# Patient Record
Sex: Female | Born: 1985 | Race: Black or African American | Hispanic: No | Marital: Married | Smoking: Never smoker
Health system: Southern US, Community
[De-identification: ages and names within clinical notes are randomized; demographics above are authoritative.]

## PROBLEM LIST (undated history)

## (undated) DIAGNOSIS — Z8489 Family history of other specified conditions: Secondary | ICD-10-CM

## (undated) DIAGNOSIS — D219 Benign neoplasm of connective and other soft tissue, unspecified: Secondary | ICD-10-CM

## (undated) HISTORY — PX: KNEE SURGERY: SHX244

---

## 2012-01-14 ENCOUNTER — Emergency Department (HOSPITAL_COMMUNITY)
Admission: EM | Admit: 2012-01-14 | Discharge: 2012-01-14 | Disposition: A | Discharge status: Home / Self Care | Payer: No Typology Code available for payment source | Attending: Emergency Medicine | Admitting: Emergency Medicine

## 2012-01-14 ENCOUNTER — Encounter (HOSPITAL_COMMUNITY): Payer: Self-pay | Admitting: *Deleted

## 2012-01-14 DIAGNOSIS — M25519 Pain in unspecified shoulder: Secondary | ICD-10-CM | POA: Insufficient documentation

## 2012-01-14 DIAGNOSIS — T148XXA Other injury of unspecified body region, initial encounter: Secondary | ICD-10-CM

## 2012-01-14 DIAGNOSIS — M542 Cervicalgia: Secondary | ICD-10-CM | POA: Insufficient documentation

## 2012-01-14 DIAGNOSIS — M79609 Pain in unspecified limb: Secondary | ICD-10-CM | POA: Insufficient documentation

## 2012-01-14 DIAGNOSIS — Y92410 Unspecified street and highway as the place of occurrence of the external cause: Secondary | ICD-10-CM | POA: Insufficient documentation

## 2012-01-14 MED ORDER — CYCLOBENZAPRINE HCL 10 MG PO TABS: 10.0000 mg | ORAL_TABLET | Freq: Two times a day (BID) | ORAL | Status: AC | PRN

## 2012-01-14 MED ORDER — TRAMADOL HCL 50 MG PO TABS: 50.0000 mg | ORAL_TABLET | Freq: Four times a day (QID) | ORAL | Status: AC | PRN
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
## 2012-01-14 NOTE — ED Provider Notes (Signed)
Medical screening examination/treatment/procedure(s) were performed by non-physician practitioner and as supervising physician I was immediately available for consultation/collaboration.   Clayton Bosserman R Zoua Caporaso, MD 01/14/12 1624 

## 2013-01-16 ENCOUNTER — Ambulatory Visit (INDEPENDENT_AMBULATORY_CARE_PROVIDER_SITE_OTHER): Payer: 59 | Admitting: Physician Assistant

## 2013-01-16 VITALS — BP 118/90 | HR 99 | Temp 98.3°F | Resp 16 | Ht 69.0 in | Wt 302.0 lb

## 2013-01-16 DIAGNOSIS — J069 Acute upper respiratory infection, unspecified: Secondary | ICD-10-CM

## 2013-01-16 DIAGNOSIS — J029 Acute pharyngitis, unspecified: Secondary | ICD-10-CM

## 2013-01-16 MED ORDER — BENZONATATE 100 MG PO CAPS: 100.0000 mg | ORAL_CAPSULE | Freq: Three times a day (TID) | ORAL | Status: DC | PRN

## 2013-01-16 MED ORDER — HYDROCODONE-HOMATROPINE 5-1.5 MG/5ML PO SYRP: 5.0000 mL | ORAL_SOLUTION | Freq: Three times a day (TID) | ORAL | Status: DC | PRN

## 2013-01-16 MED ORDER — IPRATROPIUM BROMIDE 0.03 % NA SOLN: 2.0000 | Freq: Two times a day (BID) | NASAL | Status: DC

## 2013-01-16 NOTE — Progress Notes (Signed)
  Subjective:    Patient ID: Duayne Cal, female    DOB: November 07, 1985, 27 y.o.   MRN: [id number]  HPI   Ms. Excell Seltzer is a very pleasant 27 yr old female here with concern for illness.  Symptoms began about 6 days ago.  Began with lots of nasal congestion, now with sore throat and cough.  Cough is mostly non-productive, though it felt a little looser this AM.  A little SOB at night but no wheezing.  Cough does keep her awake at night.  Denies fever, chills, HA, ear pain, abd pain, GI symptoms.  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  Has been using otc alkaseltzer which helps somewhat with symptoms.     Review of Systems  Constitutional: Negative for fever and chills.  HENT: Positive for congestion, sore throat, rhinorrhea and postnasal drip. Negative for ear pain.   Respiratory: Positive for cough and shortness of breath. Negative for wheezing.   Cardiovascular: Negative.   Gastrointestinal: Negative.   Musculoskeletal: Negative.   Skin: Negative.   Neurological: Negative.        Objective:   Physical Exam  Vitals reviewed. Constitutional: She is oriented to person, place, and time. She appears well-developed and well-nourished. No distress.  HENT:  Head: Normocephalic and atraumatic.  Right Ear: Tympanic membrane and ear canal normal.  Left Ear: Ear canal normal.  Nose: Nose normal.  Mouth/Throat: Uvula is midline. Posterior oropharyngeal erythema present. No oropharyngeal exudate, posterior oropharyngeal edema or tonsillar abscesses.  L canal occluded with cerumen  Eyes: Conjunctivae are normal. No scleral icterus.  Neck: Neck supple.  Cardiovascular: Normal rate, regular rhythm and normal heart sounds.  Exam reveals no gallop and no friction rub.   No murmur heard. Pulmonary/Chest: Effort normal and breath sounds normal. She has no wheezes. She has no rales.  Lymphadenopathy:    She has no cervical adenopathy.  Neurological: She is alert and oriented to person,  place, and time.  Skin: Skin is warm and dry.  Psychiatric: She has a normal mood and affect. Her behavior is normal.     Filed Vitals:   01/16/13 0917  BP: 118/90  Pulse: 99  Temp: 98.3 F (36.8 C)  Resp: 16    Results for orders placed in visit on 01/16/13  POCT RAPID STREP A (OFFICE)      Result Value Range   Rapid Strep A Screen Negative  Negative        Assessment & Plan:  Viral URI with cough - Plan: ipratropium (ATROVENT) 0.03 % nasal spray, benzonatate (TESSALON) 100 MG capsule, HYDROcodone-homatropine (HYCODAN) 5-1.5 MG/5ML syrup  Sore throat - Plan: POCT rapid strep A   Ms. Excell Seltzer is a pleasant 27 yr old female with URI and sore throat, suspect viral etiology.  Afebrile, rapid strep negative, exam reassuring.  Will treat with Atrovent, Tessalon, and Hycodan.  Push fluids, rest.  Discussed RTC precautions.  Pt understands and is in agreement with this plan.

## 2013-01-16 NOTE — Patient Instructions (Addendum)
Begin using the Atrovent nasal spray twice daily for relief of congestion, ear pressure, and post-nasal drainage.  Tessalon Perles every 8 hours as needed for cough.  Hycodan syrup if needed for cough at night - this will make you sleepy so only use at night.  Plenty of fluids (water is best!) and rest.  Please let me know if you feel like you are worsening or not improving (i.e. Fever, worsening cough, shortness of breath, etc)   Upper Respiratory Infection, Adult An upper respiratory infection (URI) is also sometimes known as the common cold. The upper respiratory tract includes the nose, sinuses, throat, trachea, and bronchi. Bronchi are the airways leading to the lungs. Most people improve within 1 week, but symptoms can last up to 2 weeks. A residual cough may last even longer.  CAUSES Many different viruses can infect the tissues lining the upper respiratory tract. The tissues become irritated and inflamed and often become very moist. Mucus production is also common. A cold is contagious. You can easily spread the virus to others by oral contact. This includes kissing, sharing a glass, coughing, or sneezing. Touching your mouth or nose and then touching a surface, which is then touched by another person, can also spread the virus. SYMPTOMS  Symptoms typically develop 1 to 3 days after you come in contact with a cold virus. Symptoms vary from person to person. They may include:  Runny nose.  Sneezing.  Nasal congestion.  Sinus irritation.  Sore throat.  Loss of voice (laryngitis).  Cough.  Fatigue.  Muscle aches.  Loss of appetite.  Headache.  Low-grade fever. DIAGNOSIS  You might diagnose your own cold based on familiar symptoms, since most people get a cold 2 to 3 times a year. Your caregiver can confirm this based on your exam. Most importantly, your caregiver can check that your symptoms are not due to another disease such as strep throat, sinusitis, pneumonia, asthma, or  epiglottitis. Blood tests, throat tests, and X-rays are not necessary to diagnose a common cold, but they may sometimes be helpful in excluding other more serious diseases. Your caregiver will decide if any further tests are required. RISKS AND COMPLICATIONS  You may be at risk for a more severe case of the common cold if you smoke cigarettes, have chronic heart disease (such as heart failure) or lung disease (such as asthma), or if you have a weakened immune system. The very young and very old are also at risk for more serious infections. Bacterial sinusitis, middle ear infections, and bacterial pneumonia can complicate the common cold. The common cold can worsen asthma and chronic obstructive pulmonary disease (COPD). Sometimes, these complications can require emergency medical care and may be life-threatening. PREVENTION  The best way to protect against getting a cold is to practice good hygiene. Avoid oral or hand contact with people with cold symptoms. Wash your hands often if contact occurs. There is no clear evidence that vitamin C, vitamin E, echinacea, or exercise reduces the chance of developing a cold. However, it is always recommended to get plenty of rest and practice good nutrition. TREATMENT  Treatment is directed at relieving symptoms. There is no cure. Antibiotics are not effective, because the infection is caused by a virus, not by bacteria. Treatment may include:  Increased fluid intake. Sports drinks offer valuable electrolytes, sugars, and fluids.  Breathing heated mist or steam (vaporizer or shower).  Eating chicken soup or other clear broths, and maintaining good nutrition.  Getting plenty of  rest.  Using gargles or lozenges for comfort.  Controlling fevers with ibuprofen or acetaminophen as directed by your caregiver.  Increasing usage of your inhaler if you have asthma. Zinc gel and zinc lozenges, taken in the first 24 hours of the common cold, can shorten the duration  and lessen the severity of symptoms. Pain medicines may help with fever, muscle aches, and throat pain. A variety of non-prescription medicines are available to treat congestion and runny nose. Your caregiver can make recommendations and may suggest nasal or lung inhalers for other symptoms.  HOME CARE INSTRUCTIONS   Only take over-the-counter or prescription medicines for pain, discomfort, or fever as directed by your caregiver.  Use a warm mist humidifier or inhale steam from a shower to increase air moisture. This may keep secretions moist and make it easier to breathe.  Drink enough water and fluids to keep your urine clear or pale yellow.  Rest as needed.  Return to work when your temperature has returned to normal or as your caregiver advises. You may need to stay home longer to avoid infecting others. You can also use a face mask and careful hand washing to prevent spread of the virus. SEEK MEDICAL CARE IF:   After the first few days, you feel you are getting worse rather than better.  You need your caregiver's advice about medicines to control symptoms.  You develop chills, worsening shortness of breath, or brown or red sputum. These may be signs of pneumonia.  You develop yellow or brown nasal discharge or pain in the face, especially when you bend forward. These may be signs of sinusitis.  You develop a fever, swollen neck glands, pain with swallowing, or white areas in the back of your throat. These may be signs of strep throat. SEEK IMMEDIATE MEDICAL CARE IF:   You have a fever.  You develop severe or persistent headache, ear pain, sinus pain, or chest pain.  You develop wheezing, a prolonged cough, cough up blood, or have a change in your usual mucus (if you have chronic lung disease).  You develop sore muscles or a stiff neck. Document Released: 02/25/2001 Document Revised: 11/24/2011 Document Reviewed: 01/03/2011 [facility] Patient Information 2013 [place], [place].

## 2013-01-17 ENCOUNTER — Encounter (HOSPITAL_COMMUNITY): Payer: Self-pay | Admitting: *Deleted

## 2014-11-22 ENCOUNTER — Encounter (HOSPITAL_BASED_OUTPATIENT_CLINIC_OR_DEPARTMENT_OTHER): Payer: Self-pay | Admitting: *Deleted

## 2014-11-22 DIAGNOSIS — S161XXA Strain of muscle, fascia and tendon at neck level, initial encounter: Secondary | ICD-10-CM | POA: Diagnosis not present

## 2014-11-22 DIAGNOSIS — S39012A Strain of muscle, fascia and tendon of lower back, initial encounter: Secondary | ICD-10-CM | POA: Diagnosis not present

## 2014-11-22 DIAGNOSIS — Y9389 Activity, other specified: Secondary | ICD-10-CM | POA: Diagnosis not present

## 2014-11-22 DIAGNOSIS — Y92410 Unspecified street and highway as the place of occurrence of the external cause: Secondary | ICD-10-CM | POA: Diagnosis not present

## 2014-11-22 DIAGNOSIS — Y998 Other external cause status: Secondary | ICD-10-CM | POA: Insufficient documentation

## 2014-11-22 DIAGNOSIS — S3992XA Unspecified injury of lower back, initial encounter: Secondary | ICD-10-CM | POA: Diagnosis present
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
## 2014-11-23 ENCOUNTER — Emergency Department (HOSPITAL_BASED_OUTPATIENT_CLINIC_OR_DEPARTMENT_OTHER)
Admission: EM | Admit: 2014-11-23 | Discharge: 2014-11-23 | Disposition: A | Discharge status: Home / Self Care | Payer: BLUE CROSS/BLUE SHIELD | Attending: Emergency Medicine | Admitting: Emergency Medicine

## 2014-11-23 ENCOUNTER — Emergency Department (HOSPITAL_BASED_OUTPATIENT_CLINIC_OR_DEPARTMENT_OTHER): Payer: BLUE CROSS/BLUE SHIELD

## 2014-11-23 DIAGNOSIS — S39012A Strain of muscle, fascia and tendon of lower back, initial encounter: Secondary | ICD-10-CM

## 2014-11-23 DIAGNOSIS — S161XXA Strain of muscle, fascia and tendon at neck level, initial encounter: Secondary | ICD-10-CM

## 2014-11-23 MED ORDER — CYCLOBENZAPRINE HCL 10 MG PO TABS: 10.0000 mg | ORAL_TABLET | Freq: Three times a day (TID) | ORAL | Status: DC | PRN
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
## 2014-11-23 NOTE — ED Notes (Signed)
C/o low back pain, (denies: nvd, fever, cough congestion cold sx, sob, LOC or dizziness), alert, NAD, calm, itneractive, resps e/u, speaking in clear complete sentences, VSS. No meds PTA.
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
## 2015-01-03 ENCOUNTER — Other Ambulatory Visit: Payer: Self-pay | Admitting: Family

## 2015-01-03 ENCOUNTER — Ambulatory Visit
Admission: RE | Admit: 2015-01-03 | Discharge: 2015-01-03 | Disposition: A | Discharge status: Home / Self Care | Payer: BLUE CROSS/BLUE SHIELD | Source: Ambulatory Visit | Attending: Family | Admitting: Family

## 2015-01-03 DIAGNOSIS — R509 Fever, unspecified: Secondary | ICD-10-CM

## 2015-10-25 ENCOUNTER — Other Ambulatory Visit: Payer: Self-pay | Admitting: Obstetrics and Gynecology

## 2015-10-25 ENCOUNTER — Other Ambulatory Visit (HOSPITAL_COMMUNITY)
Admission: RE | Admit: 2015-10-25 | Discharge: 2015-10-25 | Disposition: A | Discharge status: Home / Self Care | Payer: No Typology Code available for payment source | Source: Ambulatory Visit | Attending: Obstetrics and Gynecology | Admitting: Obstetrics and Gynecology

## 2015-10-25 DIAGNOSIS — Z01419 Encounter for gynecological examination (general) (routine) without abnormal findings: Secondary | ICD-10-CM | POA: Insufficient documentation

## 2015-10-26 LAB — CYTOLOGY - PAP

## 2015-11-20 LAB — OB RESULTS CONSOLE GC/CHLAMYDIA
CHLAMYDIA, DNA PROBE: NEGATIVE
GC PROBE AMP, GENITAL: NEGATIVE

## 2015-11-20 LAB — OB RESULTS CONSOLE HEPATITIS B SURFACE ANTIGEN: Hepatitis B Surface Ag: NEGATIVE

## 2015-11-20 LAB — OB RESULTS CONSOLE ABO/RH: RH Type: POSITIVE

## 2015-11-20 LAB — OB RESULTS CONSOLE ANTIBODY SCREEN: ANTIBODY SCREEN: NEGATIVE

## 2015-11-20 LAB — OB RESULTS CONSOLE RPR: RPR: NONREACTIVE

## 2015-11-20 LAB — OB RESULTS CONSOLE HIV ANTIBODY (ROUTINE TESTING): HIV: NONREACTIVE

## 2015-11-20 LAB — OB RESULTS CONSOLE RUBELLA ANTIBODY, IGM: Rubella: IMMUNE

## 2016-01-17 ENCOUNTER — Encounter (HOSPITAL_COMMUNITY): Payer: Self-pay

## 2016-01-17 ENCOUNTER — Other Ambulatory Visit (HOSPITAL_COMMUNITY): Payer: Self-pay | Admitting: Obstetrics and Gynecology

## 2016-01-17 ENCOUNTER — Ambulatory Visit (HOSPITAL_COMMUNITY)
Admission: RE | Admit: 2016-01-17 | Discharge: 2016-01-17 | Disposition: A | Discharge status: Home / Self Care | Payer: Commercial Managed Care - HMO | Source: Ambulatory Visit | Attending: Obstetrics and Gynecology | Admitting: Obstetrics and Gynecology

## 2016-01-17 DIAGNOSIS — IMO0002 Reserved for concepts with insufficient information to code with codable children: Secondary | ICD-10-CM

## 2016-01-17 DIAGNOSIS — Z3A14 14 weeks gestation of pregnancy: Secondary | ICD-10-CM

## 2016-01-17 DIAGNOSIS — Z3689 Encounter for other specified antenatal screening: Secondary | ICD-10-CM

## 2016-01-21 ENCOUNTER — Ambulatory Visit (HOSPITAL_COMMUNITY): Payer: No Typology Code available for payment source

## 2016-03-10 ENCOUNTER — Encounter (HOSPITAL_COMMUNITY): Payer: Self-pay | Admitting: Obstetrics and Gynecology

## 2016-03-11 ENCOUNTER — Other Ambulatory Visit (HOSPITAL_COMMUNITY): Payer: Self-pay | Admitting: Obstetrics and Gynecology

## 2016-03-11 DIAGNOSIS — Z3689 Encounter for other specified antenatal screening: Secondary | ICD-10-CM

## 2016-03-17 ENCOUNTER — Ambulatory Visit (HOSPITAL_COMMUNITY): Payer: No Typology Code available for payment source

## 2016-03-31 ENCOUNTER — Ambulatory Visit (HOSPITAL_COMMUNITY)
Admission: RE | Admit: 2016-03-31 | Discharge: 2016-03-31 | Disposition: A | Discharge status: Home / Self Care | Payer: Managed Care, Other (non HMO) | Source: Ambulatory Visit | Attending: Obstetrics and Gynecology | Admitting: Obstetrics and Gynecology

## 2016-03-31 DIAGNOSIS — Z3689 Encounter for other specified antenatal screening: Secondary | ICD-10-CM

## 2016-03-31 DIAGNOSIS — Z36 Encounter for antenatal screening of mother: Secondary | ICD-10-CM | POA: Diagnosis present

## 2016-03-31 DIAGNOSIS — Z3A25 25 weeks gestation of pregnancy: Secondary | ICD-10-CM | POA: Insufficient documentation

## 2016-03-31 DIAGNOSIS — O99212 Obesity complicating pregnancy, second trimester: Secondary | ICD-10-CM | POA: Diagnosis not present

## 2016-06-17 LAB — OB RESULTS CONSOLE GBS: GBS: NEGATIVE

## 2016-07-07 ENCOUNTER — Encounter (HOSPITAL_COMMUNITY): Payer: Self-pay | Admitting: *Deleted

## 2016-07-08 ENCOUNTER — Encounter (HOSPITAL_COMMUNITY)
Admission: RE | Admit: 2016-07-08 | Discharge: 2016-07-08 | Disposition: A | Discharge status: Home / Self Care | Payer: Managed Care, Other (non HMO) | Source: Ambulatory Visit | Attending: Obstetrics and Gynecology | Admitting: Obstetrics and Gynecology

## 2016-07-08 ENCOUNTER — Other Ambulatory Visit: Payer: Self-pay | Admitting: Obstetrics and Gynecology

## 2016-07-08 LAB — CBC
HCT: 37.2 % (ref 36.0–46.0)
Hemoglobin: 12.9 g/dL (ref 12.0–15.0)
MCH: 28.9 pg (ref 26.0–34.0)
MCHC: 34.7 g/dL (ref 30.0–36.0)
MCV: 83.2 fL (ref 78.0–100.0)
Platelets: 263 10*3/uL (ref 150–400)
RBC: 4.47 MIL/uL (ref 3.87–5.11)
RDW: 15.5 % (ref 11.5–15.5)
WBC: 9.1 10*3/uL (ref 4.0–10.5)

## 2016-07-08 LAB — TYPE AND SCREEN
ABO/RH(D): B POS
Antibody Screen: NEGATIVE

## 2016-07-08 LAB — ABO/RH: ABO/RH(D): B POS

## 2016-07-08 NOTE — Anesthesia Preprocedure Evaluation (Addendum)
Anesthesia Evaluation  Patient identified by MRN, date of birth, ID band Patient awake    Reviewed: Allergy & Precautions, NPO status , Patient's Chart, lab work & pertinent test results  History of Anesthesia Complications Negative for: history of anesthetic complications  Airway Mallampati: II  TM Distance: >3 FB Neck ROM: Full    Dental no notable dental hx. (+) Dental Advisory Given   Pulmonary neg pulmonary ROS,    Pulmonary exam normal breath sounds clear to auscultation       Cardiovascular negative cardio ROS Normal cardiovascular exam Rhythm:Regular Rate:Normal     Neuro/Psych negative neurological ROS  negative psych ROS   GI/Hepatic negative GI ROS, Neg liver ROS,   Endo/Other  Morbid obesity  Renal/GU negative Renal ROS  negative genitourinary   Musculoskeletal negative musculoskeletal ROS (+)   Abdominal   Peds negative pediatric ROS (+)  Hematology negative hematology ROS (+)   Anesthesia Other Findings   Reproductive/Obstetrics (+) Pregnancy                            Anesthesia Physical Anesthesia Plan  ASA: III  Anesthesia Plan: Spinal   Post-op Pain Management:    Induction:   Airway Management Planned:   Additional Equipment:   Intra-op Plan:   Post-operative Plan:   Informed Consent: I have reviewed the patients History and Physical, chart, labs and discussed the procedure including the risks, benefits and alternatives for the proposed anesthesia with the patient or authorized representative who has indicated his/her understanding and acceptance.   Dental advisory given  Plan Discussed with: CRNA  Anesthesia Plan Comments:         Anesthesia Quick Evaluation

## 2016-07-08 NOTE — Patient Instructions (Signed)
[place]  07/08/2016   Your procedure is scheduled on:  07/09/2016  Enter through the Main Entrance of [facility] at [place] up the phone at the desk and dial [phone].   Call this number if you have problems the morning of surgery: [phone]   Remember:   Do not eat food:After Midnight.  Do not drink clear liquids: After Midnight.  Take these medicines the morning of surgery with A SIP OF WATER: none   Do not wear jewelry, make-up or nail polish.  Do not wear lotions, powders, or perfumes. Do not wear deodorant.  Do not shave 48 hours prior to surgery.  Do not bring valuables to the hospital.  [facility] is not   responsible for any belongings or valuables brought to the hospital.  Contacts, dentures or bridgework may not be worn into surgery.  Leave suitcase in the car. After surgery it may be brought to your room.  For patients admitted to the hospital, checkout time is 11:00 AM the day of              discharge.   Patients discharged the day of surgery will not be allowed to drive             home.  Name and phone number of your driver: na  Special Instructions:   N/A   Please read over the following fact sheets that you were given:   Surgical Site Infection Prevention

## 2016-07-08 NOTE — H&P (Signed)
Michele Pacheco is a 30 y.o. female G1P0 at 48 wks and 5 days  presenting for cesarean section due to cephalopelvic disproportion caused by 10 cm posterior cervical fibroid. Pregnancy has been otherwise uncomplicated. Pt receive prenatal care at [facility]. Her primary provider is Dr. Christophe Louis. . OB History    Gravida Para Term Preterm AB Living   1             SAB TAB Ectopic Multiple Live Births                 Past Medical History:  Diagnosis Date  . Fibroid    Past Surgical History:  Procedure Laterality Date  . KNEE SURGERY     Family History: family history includes COPD in her paternal grandfather; Diabetes in her maternal grandfather and mother; Hyperlipidemia in her father; Hypertension in her father, mother, and paternal grandmother. Social History:  reports that she has never smoked. She has never used smokeless tobacco. She reports that she drinks alcohol. She reports that she does not use drugs.     Maternal Diabetes: No Genetic Screening: Normal Maternal Ultrasounds/Referrals: Normal Fetal Ultrasounds or other Referrals:  None Maternal Substance Abuse:  No Significant Maternal Medications:  None Significant Maternal Lab Results:  Lab values include: Group B Strep negative Other Comments:  None  Review of Systems  Constitutional: Negative.   HENT: Negative.   Eyes: Negative.   Respiratory: Negative.   Cardiovascular: Negative.   Gastrointestinal: Negative.   Genitourinary: Negative.   Musculoskeletal: Negative.   Skin: Negative.   Neurological: Negative.   Endo/Heme/Allergies: Negative.   Psychiatric/Behavioral: Negative.    Maternal Medical History:  Fetal activity: Perceived fetal activity is normal.   Last perceived fetal movement was within the past hour.    Prenatal Complications - Diabetes: none.      Last menstrual period 10/05/2015. Maternal Exam:  Uterine Assessment: Contraction frequency is rare.   Abdomen: Patient reports no  abdominal tenderness. Estimated fetal weight is 8 lbs 14 oz .   Fetal presentation: vertex  Introitus: Normal vulva. Normal vagina.  Pelvis: of concern for delivery.      Physical Exam  Vitals reviewed. Constitutional: She is oriented to person, place, and time. She appears well-developed and well-nourished.  HENT:  Head: Normocephalic and atraumatic.  Eyes: Conjunctivae are normal. Pupils are equal, round, and reactive to light.  Neck: Normal range of motion. Neck supple.  Cardiovascular: Normal rate and regular rhythm.   Respiratory: Effort normal and breath sounds normal. No respiratory distress.  GI: Bowel sounds are normal. There is no tenderness.  Genitourinary: Vagina normal.  Musculoskeletal: Normal range of motion. She exhibits no edema.  Neurological: She is alert and oriented to person, place, and time. She has normal reflexes.  Skin: Skin is warm and dry.  Psychiatric: She has a normal mood and affect.    Prenatal labs: ABO, Rh: --/--/B POS, B POS (10/24 1100) Antibody: NEG (10/24 1100) Rubella: Immune (03/07 0000) RPR: Nonreactive (03/07 0000)  HBsAg: Negative (03/07 0000)  HIV: Non-reactive (03/07 0000)  GBS: Negative (10/03 0000)   Assessment/Plan: 39 wks and 5 days with CPD due to 10 cm posterior cervical fibroid. Due to pelvic outlet obstruction I recommend Cesarean section. R/B/A of cesarean section were discussed with the patient including but not limited to infection/ bleeding / damage to bowel / bladder and baby with the need for further surgery. R/O transfusion HIV/ Hep B&C were discussed. Pt voiced understanding and  desires to proceed.    Jaydy Fitzhenry J. 07/08/2016, 6:36 PM

## 2016-07-08 NOTE — H&P (Deleted)
  The note originally documented on this encounter has been moved the the encounter in which it belongs.  

## 2016-07-09 ENCOUNTER — Inpatient Hospital Stay (HOSPITAL_COMMUNITY): Payer: Managed Care, Other (non HMO) | Admitting: Anesthesiology

## 2016-07-09 ENCOUNTER — Inpatient Hospital Stay (HOSPITAL_COMMUNITY)
Admission: AD | Admit: 2016-07-09 | Discharge: 2016-07-11 | DRG: 765 | Disposition: A | Discharge status: Home / Self Care | Payer: Managed Care, Other (non HMO) | Source: Ambulatory Visit | Attending: Obstetrics and Gynecology | Admitting: Obstetrics and Gynecology

## 2016-07-09 ENCOUNTER — Encounter (HOSPITAL_COMMUNITY)
Admission: AD | Disposition: A | Discharge status: Home / Self Care | Payer: Self-pay | Source: Ambulatory Visit | Attending: Obstetrics and Gynecology

## 2016-07-09 ENCOUNTER — Encounter (HOSPITAL_COMMUNITY): Payer: Self-pay | Admitting: Emergency Medicine

## 2016-07-09 DIAGNOSIS — Z6841 Body Mass Index (BMI) 40.0 and over, adult: Secondary | ICD-10-CM | POA: Diagnosis not present

## 2016-07-09 DIAGNOSIS — D259 Leiomyoma of uterus, unspecified: Secondary | ICD-10-CM | POA: Diagnosis present

## 2016-07-09 DIAGNOSIS — O338 Maternal care for disproportion of other origin: Secondary | ICD-10-CM | POA: Diagnosis present

## 2016-07-09 DIAGNOSIS — O3413 Maternal care for benign tumor of corpus uteri, third trimester: Secondary | ICD-10-CM | POA: Diagnosis present

## 2016-07-09 DIAGNOSIS — Z98891 History of uterine scar from previous surgery: Secondary | ICD-10-CM

## 2016-07-09 DIAGNOSIS — Z3A39 39 weeks gestation of pregnancy: Secondary | ICD-10-CM

## 2016-07-09 DIAGNOSIS — O99214 Obesity complicating childbirth: Secondary | ICD-10-CM | POA: Diagnosis present

## 2016-07-09 HISTORY — DX: Family history of other specified conditions: Z84.89

## 2016-07-09 HISTORY — DX: Benign neoplasm of connective and other soft tissue, unspecified: D21.9

## 2016-07-09 LAB — RPR: RPR: NONREACTIVE

## 2016-07-09 SURGERY — Surgical Case
Anesthesia: Spinal

## 2016-07-09 MED ORDER — SCOPOLAMINE 1 MG/3DAYS TD PT72
MEDICATED_PATCH | TRANSDERMAL | Status: AC
  Administered 2016-07-09: 1.5 mg via TRANSDERMAL
  Filled 2016-07-09: qty 1

## 2016-07-09 MED ORDER — DIPHENHYDRAMINE HCL 50 MG/ML IJ SOLN: 12.5000 mg | INTRAMUSCULAR | Status: DC | PRN

## 2016-07-09 MED ORDER — FENTANYL CITRATE (PF) 100 MCG/2ML IJ SOLN
INTRAMUSCULAR | Status: AC
  Filled 2016-07-09: qty 2

## 2016-07-09 MED ORDER — METHYLERGONOVINE MALEATE 0.2 MG PO TABS: 0.2000 mg | ORAL_TABLET | ORAL | Status: DC | PRN

## 2016-07-09 MED ORDER — SIMETHICONE 80 MG PO CHEW
80.0000 mg | CHEWABLE_TABLET | ORAL | Status: DC
  Administered 2016-07-10 (×2): 80 mg via ORAL
  Filled 2016-07-09 (×2): qty 1

## 2016-07-09 MED ORDER — DIPHENHYDRAMINE HCL 25 MG PO CAPS: 25.0000 mg | ORAL_CAPSULE | Freq: Four times a day (QID) | ORAL | Status: DC | PRN

## 2016-07-09 MED ORDER — MENTHOL 3 MG MT LOZG: 1.0000 | LOZENGE | OROMUCOSAL | Status: DC | PRN

## 2016-07-09 MED ORDER — DIBUCAINE 1 % RE OINT: 1.0000 "application " | TOPICAL_OINTMENT | RECTAL | Status: DC | PRN

## 2016-07-09 MED ORDER — ONDANSETRON HCL 4 MG/2ML IJ SOLN
INTRAMUSCULAR | Status: AC
  Filled 2016-07-09: qty 2

## 2016-07-09 MED ORDER — METHYLERGONOVINE MALEATE 0.2 MG/ML IJ SOLN: 0.2000 mg | INTRAMUSCULAR | Status: DC | PRN

## 2016-07-09 MED ORDER — SCOPOLAMINE 1 MG/3DAYS TD PT72: 1.0000 | MEDICATED_PATCH | Freq: Once | TRANSDERMAL | Status: DC

## 2016-07-09 MED ORDER — ONDANSETRON HCL 4 MG/2ML IJ SOLN: 4.0000 mg | Freq: Once | INTRAMUSCULAR | Status: DC | PRN

## 2016-07-09 MED ORDER — NALOXONE HCL 0.4 MG/ML IJ SOLN: 0.4000 mg | INTRAMUSCULAR | Status: DC | PRN

## 2016-07-09 MED ORDER — MORPHINE SULFATE-NACL 0.5-0.9 MG/ML-% IV SOSY
PREFILLED_SYRINGE | INTRAVENOUS | Status: AC
  Filled 2016-07-09: qty 1

## 2016-07-09 MED ORDER — SOD CITRATE-CITRIC ACID 500-334 MG/5ML PO SOLN
30.0000 mL | ORAL | Status: AC
  Administered 2016-07-09: 30 mL via ORAL

## 2016-07-09 MED ORDER — NALBUPHINE HCL 10 MG/ML IJ SOLN: 5.0000 mg | INTRAMUSCULAR | Status: DC | PRN

## 2016-07-09 MED ORDER — LACTATED RINGERS IV SOLN
INTRAVENOUS | Status: DC | PRN
  Administered 2016-07-09: 08:00:00 via INTRAVENOUS

## 2016-07-09 MED ORDER — KETOROLAC TROMETHAMINE 30 MG/ML IJ SOLN
INTRAMUSCULAR | Status: AC
  Filled 2016-07-09: qty 1

## 2016-07-09 MED ORDER — IBUPROFEN 600 MG PO TABS
600.0000 mg | ORAL_TABLET | Freq: Four times a day (QID) | ORAL | Status: DC
  Administered 2016-07-09 – 2016-07-11 (×7): 600 mg via ORAL
  Filled 2016-07-09 (×7): qty 1

## 2016-07-09 MED ORDER — SCOPOLAMINE 1 MG/3DAYS TD PT72
1.0000 | MEDICATED_PATCH | Freq: Once | TRANSDERMAL | Status: DC
  Administered 2016-07-09: 1.5 mg via TRANSDERMAL

## 2016-07-09 MED ORDER — OXYTOCIN 10 UNIT/ML IJ SOLN
INTRAMUSCULAR | Status: AC
  Filled 2016-07-09: qty 4

## 2016-07-09 MED ORDER — LACTATED RINGERS IV SOLN
INTRAVENOUS | Status: DC
  Administered 2016-07-09: 125 mL/h via INTRAVENOUS

## 2016-07-09 MED ORDER — PHENYLEPHRINE 8 MG IN D5W 100 ML (0.08MG/ML) PREMIX OPTIME
INJECTION | INTRAVENOUS | Status: AC
  Filled 2016-07-09: qty 100

## 2016-07-09 MED ORDER — FENTANYL CITRATE (PF) 100 MCG/2ML IJ SOLN
25.0000 ug | INTRAMUSCULAR | Status: DC | PRN
  Administered 2016-07-09 (×3): 25 ug via INTRAVENOUS

## 2016-07-09 MED ORDER — ZOLPIDEM TARTRATE 5 MG PO TABS: 5.0000 mg | ORAL_TABLET | Freq: Every evening | ORAL | Status: DC | PRN

## 2016-07-09 MED ORDER — KETOROLAC TROMETHAMINE 30 MG/ML IJ SOLN
30.0000 mg | Freq: Four times a day (QID) | INTRAMUSCULAR | Status: DC | PRN
  Administered 2016-07-09: 30 mg via INTRAMUSCULAR

## 2016-07-09 MED ORDER — SODIUM CHLORIDE 0.9% FLUSH: 3.0000 mL | INTRAVENOUS | Status: DC | PRN

## 2016-07-09 MED ORDER — LACTATED RINGERS IV SOLN
INTRAVENOUS | Status: DC
  Administered 2016-07-09 (×3): via INTRAVENOUS

## 2016-07-09 MED ORDER — SCOPOLAMINE 1 MG/3DAYS TD PT72: 1.0000 | MEDICATED_PATCH | TRANSDERMAL | Status: DC

## 2016-07-09 MED ORDER — METOCLOPRAMIDE HCL 5 MG/ML IJ SOLN
INTRAMUSCULAR | Status: DC | PRN
  Administered 2016-07-09 (×2): 5 mg via INTRAVENOUS

## 2016-07-09 MED ORDER — ONDANSETRON HCL 4 MG/2ML IJ SOLN
INTRAMUSCULAR | Status: DC | PRN
  Administered 2016-07-09: 4 mg via INTRAVENOUS

## 2016-07-09 MED ORDER — OXYTOCIN 40 UNITS IN LACTATED RINGERS INFUSION - SIMPLE MED
INTRAVENOUS | Status: DC | PRN
  Administered 2016-07-09: 40 [IU] via INTRAVENOUS

## 2016-07-09 MED ORDER — NALOXONE HCL 2 MG/2ML IJ SOSY
1.0000 ug/kg/h | PREFILLED_SYRINGE | INTRAMUSCULAR | Status: DC | PRN
  Filled 2016-07-09: qty 2

## 2016-07-09 MED ORDER — WITCH HAZEL-GLYCERIN EX PADS
1.0000 | MEDICATED_PAD | CUTANEOUS | Status: DC | PRN
Start: 2016-07-09 — End: 2016-07-11

## 2016-07-09 MED ORDER — DEXTROSE 5 % IV SOLN
3.0000 g | INTRAVENOUS | Status: AC
  Administered 2016-07-09: 3 g via INTRAVENOUS
  Filled 2016-07-09: qty 3000

## 2016-07-09 MED ORDER — BUPIVACAINE IN DEXTROSE 0.75-8.25 % IT SOLN
INTRATHECAL | Status: DC | PRN
  Administered 2016-07-09: 1.8 mL via INTRATHECAL

## 2016-07-09 MED ORDER — SENNOSIDES-DOCUSATE SODIUM 8.6-50 MG PO TABS
2.0000 | ORAL_TABLET | ORAL | Status: DC
  Administered 2016-07-10 (×2): 2 via ORAL
  Filled 2016-07-09 (×2): qty 2

## 2016-07-09 MED ORDER — ACETAMINOPHEN 325 MG PO TABS
650.0000 mg | ORAL_TABLET | ORAL | Status: DC | PRN
  Administered 2016-07-10: 650 mg via ORAL
  Filled 2016-07-09: qty 2

## 2016-07-09 MED ORDER — FENTANYL CITRATE (PF) 100 MCG/2ML IJ SOLN
INTRAMUSCULAR | Status: DC | PRN
Start: 2016-07-09 — End: 2016-07-09
  Administered 2016-07-09: 10 ug via INTRAVENOUS

## 2016-07-09 MED ORDER — NALBUPHINE HCL 10 MG/ML IJ SOLN: 5.0000 mg | Freq: Once | INTRAMUSCULAR | Status: DC | PRN

## 2016-07-09 MED ORDER — OXYCODONE HCL 5 MG PO TABS
5.0000 mg | ORAL_TABLET | ORAL | Status: DC | PRN
  Filled 2016-07-09: qty 1

## 2016-07-09 MED ORDER — PHENYLEPHRINE 8 MG IN D5W 100 ML (0.08MG/ML) PREMIX OPTIME
INJECTION | INTRAVENOUS | Status: DC | PRN
  Administered 2016-07-09: 60 ug/min via INTRAVENOUS

## 2016-07-09 MED ORDER — SIMETHICONE 80 MG PO CHEW: 80.0000 mg | CHEWABLE_TABLET | ORAL | Status: DC | PRN

## 2016-07-09 MED ORDER — SODIUM CHLORIDE 0.9 % IR SOLN
Status: DC | PRN
  Administered 2016-07-09: 1000 mL

## 2016-07-09 MED ORDER — MORPHINE SULFATE (PF) 0.5 MG/ML IJ SOLN
INTRAMUSCULAR | Status: DC | PRN
  Administered 2016-07-09: .2 mg via EPIDURAL

## 2016-07-09 MED ORDER — KETOROLAC TROMETHAMINE 30 MG/ML IJ SOLN: 30.0000 mg | Freq: Four times a day (QID) | INTRAMUSCULAR | Status: DC | PRN

## 2016-07-09 MED ORDER — PRENATAL MULTIVITAMIN CH
1.0000 | ORAL_TABLET | Freq: Every day | ORAL | Status: DC
  Administered 2016-07-10 – 2016-07-11 (×2): 1 via ORAL
  Filled 2016-07-09 (×2): qty 1

## 2016-07-09 MED ORDER — OXYCODONE HCL 5 MG PO TABS
10.0000 mg | ORAL_TABLET | ORAL | Status: DC | PRN
  Filled 2016-07-09: qty 2

## 2016-07-09 MED ORDER — MEPERIDINE HCL 25 MG/ML IJ SOLN: 6.2500 mg | INTRAMUSCULAR | Status: DC | PRN

## 2016-07-09 MED ORDER — LACTATED RINGERS IV SOLN: Freq: Once | INTRAVENOUS | Status: DC

## 2016-07-09 MED ORDER — LACTATED RINGERS IV SOLN
Freq: Once | INTRAVENOUS | Status: AC
  Administered 2016-07-09: 07:00:00 via INTRAVENOUS

## 2016-07-09 MED ORDER — SOD CITRATE-CITRIC ACID 500-334 MG/5ML PO SOLN
ORAL | Status: AC
  Administered 2016-07-09: 30 mL via ORAL
  Filled 2016-07-09: qty 15

## 2016-07-09 MED ORDER — LACTATED RINGERS IV SOLN
INTRAVENOUS | Status: DC
  Administered 2016-07-09: 17:00:00 via INTRAVENOUS

## 2016-07-09 MED ORDER — SIMETHICONE 80 MG PO CHEW
80.0000 mg | CHEWABLE_TABLET | Freq: Three times a day (TID) | ORAL | Status: DC
  Administered 2016-07-09 – 2016-07-11 (×6): 80 mg via ORAL
  Filled 2016-07-09 (×6): qty 1

## 2016-07-09 MED ORDER — DIPHENHYDRAMINE HCL 25 MG PO CAPS: 25.0000 mg | ORAL_CAPSULE | ORAL | Status: DC | PRN

## 2016-07-09 MED ORDER — OXYTOCIN 40 UNITS IN LACTATED RINGERS INFUSION - SIMPLE MED: 2.5000 [IU]/h | INTRAVENOUS | Status: AC

## 2016-07-09 MED ORDER — ONDANSETRON HCL 4 MG/2ML IJ SOLN: 4.0000 mg | Freq: Three times a day (TID) | INTRAMUSCULAR | Status: DC | PRN

## 2016-07-09 MED ORDER — ACETAMINOPHEN 500 MG PO TABS
1000.0000 mg | ORAL_TABLET | Freq: Four times a day (QID) | ORAL | Status: AC
  Administered 2016-07-09 – 2016-07-10 (×2): 1000 mg via ORAL
  Filled 2016-07-09 (×2): qty 2

## 2016-07-09 MED ORDER — COCONUT OIL OIL
1.0000 | TOPICAL_OIL | Status: DC | PRN
Start: 2016-07-09 — End: 2016-07-11
  Administered 2016-07-09: 1 via TOPICAL
  Filled 2016-07-09: qty 120

## 2016-07-09 MED ORDER — PHENYLEPHRINE 40 MCG/ML (10ML) SYRINGE FOR IV PUSH (FOR BLOOD PRESSURE SUPPORT)
PREFILLED_SYRINGE | INTRAVENOUS | Status: AC
  Filled 2016-07-09: qty 10

## 2016-07-09 SURGICAL SUPPLY — 39 items
BARRIER ADHS 3X4 INTERCEED (GAUZE/BANDAGES/DRESSINGS) ×2 IMPLANT
BENZOIN TINCTURE PRP APPL 2/3 (GAUZE/BANDAGES/DRESSINGS) ×2 IMPLANT
CHLORAPREP W/TINT 26ML (MISCELLANEOUS) ×2 IMPLANT
CLAMP CORD UMBIL (MISCELLANEOUS) ×2 IMPLANT
CLOSURE STERI STRIP 1/2 X4 (GAUZE/BANDAGES/DRESSINGS) ×2 IMPLANT
CLOTH BEACON ORANGE TIMEOUT ST (SAFETY) ×2 IMPLANT
CONTAINER PREFILL 10% NBF 15ML (MISCELLANEOUS) IMPLANT
DRESSING DISP NPWT PICO 4X12 (MISCELLANEOUS) ×2 IMPLANT
DRSG OPSITE POSTOP 4X10 (GAUZE/BANDAGES/DRESSINGS) ×2 IMPLANT
ELECT REM PT RETURN 9FT ADLT (ELECTROSURGICAL) ×2
ELECTRODE REM PT RTRN 9FT ADLT (ELECTROSURGICAL) ×1 IMPLANT
EXTRACTOR VACUUM KIWI (MISCELLANEOUS) IMPLANT
GLOVE BIOGEL M 6.5 STRL (GLOVE) ×4 IMPLANT
GLOVE BIOGEL PI IND STRL 6.5 (GLOVE) ×1 IMPLANT
GLOVE BIOGEL PI IND STRL 7.0 (GLOVE) ×1 IMPLANT
GLOVE BIOGEL PI INDICATOR 6.5 (GLOVE) ×1
GLOVE BIOGEL PI INDICATOR 7.0 (GLOVE) ×1
GOWN STRL REUS W/TWL LRG LVL3 (GOWN DISPOSABLE) ×6 IMPLANT
KIT ABG SYR 3ML LUER SLIP (SYRINGE) IMPLANT
NEEDLE HYPO 25X5/8 SAFETYGLIDE (NEEDLE) IMPLANT
NS IRRIG 1000ML POUR BTL (IV SOLUTION) ×2 IMPLANT
PACK C SECTION WH (CUSTOM PROCEDURE TRAY) ×2 IMPLANT
PAD OB MATERNITY 4.3X12.25 (PERSONAL CARE ITEMS) ×2 IMPLANT
PENCIL SMOKE EVAC W/HOLSTER (ELECTROSURGICAL) ×2 IMPLANT
RETRACTOR TRAXI PANNICULUS (MISCELLANEOUS) ×1 IMPLANT
RTRCTR C-SECT PINK 25CM LRG (MISCELLANEOUS) ×2 IMPLANT
STRIP CLOSURE SKIN 1/2X4 (GAUZE/BANDAGES/DRESSINGS) ×2 IMPLANT
SUT PDS AB 0 CT1 27 (SUTURE) ×4 IMPLANT
SUT PLAIN 0 NONE (SUTURE) IMPLANT
SUT VIC AB 0 CTX 36 (SUTURE) ×3
SUT VIC AB 0 CTX36XBRD ANBCTRL (SUTURE) ×3 IMPLANT
SUT VIC AB 2-0 CT1 27 (SUTURE) ×1
SUT VIC AB 2-0 CT1 TAPERPNT 27 (SUTURE) ×1 IMPLANT
SUT VIC AB 3-0 SH 27 (SUTURE) ×1
SUT VIC AB 3-0 SH 27X BRD (SUTURE) ×1 IMPLANT
SUT VIC AB 4-0 KS 27 (SUTURE) ×2 IMPLANT
TOWEL OR 17X24 6PK STRL BLUE (TOWEL DISPOSABLE) ×2 IMPLANT
TRAXI PANNICULUS RETRACTOR (MISCELLANEOUS) ×1
TRAY FOLEY CATH SILVER 14FR (SET/KITS/TRAYS/PACK) ×2 IMPLANT

## 2016-07-09 NOTE — Anesthesia Procedure Notes (Signed)
Spinal  Patient location during procedure: OR Staffing Anesthesiologist: Lauretta Grill Performed: anesthesiologist  Preanesthetic Checklist Completed: patient identified, site marked, surgical consent, pre-op evaluation, timeout performed, IV checked, risks and benefits discussed and monitors and equipment checked Spinal Block Patient position: sitting Prep: DuraPrep Patient monitoring: continuous pulse ox, blood pressure and heart rate Approach: midline Location: L3-4 Injection technique: single-shot Needle Needle type: Spinocan  Needle gauge: 24 G Needle length: 9 cm Additional Notes Functioning IV was confirmed and monitors were applied. Sterile prep and drape, including hand hygiene, mask and sterile gloves were used. The patient was positioned and the spine was prepped. The skin was anesthetized with lidocaine.  Free flow of clear CSF was obtained prior to injecting local anesthetic into the CSF.  The spinal needle aspirated freely following injection.  The needle was carefully withdrawn.  The patient tolerated the procedure well. Consent was obtained prior to procedure with all questions answered and concerns addressed. Risks including but not limited to bleeding, infection, nerve damage, paralysis, failed block, inadequate analgesia, allergic reaction, high spinal, itching and headache were discussed and the patient wished to proceed.   Lauretta Grill, MD

## 2016-07-09 NOTE — Transfer of Care (Signed)
Immediate Anesthesia Transfer of Care Note  Patient: Michele Pacheco  Procedure(s) Performed: Procedure(s) with comments: CESAREAN SECTION (N/A) - RNFA International aid/development worker confirmed for case  Patient Location: PACU  Anesthesia Type:Spinal  Level of Consciousness: awake, alert  and oriented  Airway & Oxygen Therapy: Patient Spontanous Breathing  Post-op Assessment: Report given to RN and Post -op Vital signs reviewed and stable, block receding,patient able to bend both knees.  Post vital signs: Reviewed and stable  Last Vitals:  Vitals:   07/09/16 0607  BP: 127/83  Pulse: 95  Resp: 16  Temp: 36.7 C    Last Pain:  Vitals:   07/09/16 0607  TempSrc: Oral      Patients Stated Pain Goal: 4 ([id number])  Complications: No apparent anesthesia complications

## 2016-07-09 NOTE — Anesthesia Postprocedure Evaluation (Signed)
Anesthesia Post Note  Patient: Michele Pacheco  Procedure(s) Performed: Procedure(s) (LRB): CESAREAN SECTION (N/A)  Patient location during evaluation: PACU Anesthesia Type: Spinal Level of consciousness: oriented and awake and alert Pain management: pain level controlled Vital Signs Assessment: post-procedure vital signs reviewed and stable Respiratory status: spontaneous breathing, respiratory function stable and patient connected to nasal cannula oxygen Cardiovascular status: blood pressure returned to baseline and stable Postop Assessment: no headache, no backache, spinal receding and patient able to bend at knees Anesthetic complications: no     Last Vitals:  Vitals:   07/09/16 1015 07/09/16 1115  BP: 125/66 127/60  Pulse: (!) 59 63  Resp: 18 18  Temp: 36.4 C 36.7 C    Last Pain:  Vitals:   07/09/16 1120  TempSrc:   PainSc: 2    Pain Goal: Patients Stated Pain Goal: 4 (07/09/16 0607)               Jamontae Thwaites, Majorie JENNETTE

## 2016-07-09 NOTE — Op Note (Signed)
Cesarean Section Procedure Note  Indications: cephalo-pelvic disproportion 2) Cervical Fibroid   Pre-operative Diagnosis: 39 week 5 day pregnancy.  Post-operative Diagnosis: same  Surgeon: Catha Brow.   Assistants: Claretta Fraise RNFA  Anesthesia: Spinal anesthesia  ASA Class: 2   Procedure Details   The patient was seen in the Holding Room. The risks, benefits, complications, treatment options, and expected outcomes were discussed with the patient.  The patient concurred with the proposed plan, giving informed consent.  The site of surgery properly noted/marked. The patient was taken to Operating Room # 9, identified as [facility] and the procedure verified as C-Section Delivery. A Time Out was held and the above information confirmed.  After induction of anesthesia, the patient was draped and prepped in the usual sterile manner. A Pfannenstiel incision was made and carried down through the subcutaneous tissue to the fascia. Fascial incision was made and extended transversely. The fascia was separated from the underlying rectus tissue superiorly and inferiorly. The peritoneum was identified and entered. Peritoneal incision was extended longitudinally. The utero-vesical peritoneal reflection was incised transversely and the bladder flap was bluntly freed from the lower uterine segment. A low transverse uterine incision was made. Delivered from cephalic presentation was a  Female with Apgar scores of 2 at one minute , 7 at five minutes and 9 at 10 minutes . After the umbilical cord was clamped and cut cord blood was obtained for evaluation. The placenta was removed intact and appeared normal. The uterine outline, tubes and ovaries appeared normal. The uterine incision was closed with running locked sutures of 0 vicryl. A second layer of 0 vicryl was used to imbricate the incision . Hemostasis was observed. Lavage was carried out until clear. Interseed was placed along the uterine  incision.  The fascia was then reapproximated with running sutures of 0 pds.  Subcutaneous layer was closed with 2-0 vicryl. The skin was reapproximated with 4-0 vicryl .   Instrument, sponge, and needle counts were correct prior the abdominal closure and at the conclusion of the case.   Findings:  Female infant in the cephalic presentation with nuchal cord x 1. Normal fallopian tubes and ovaries.   Estimated Blood Loss:  650 mL         Drains: None         Total IV Fluids:  Per anesthesia ml         Specimens: Placenta to labor and delivery           Implants: none         Complications:  None; patient tolerated the procedure well.         Disposition: PACU - hemodynamically stable.         Condition: stable  Attending Attestation: I performed the procedure.

## 2016-07-09 NOTE — Anesthesia Postprocedure Evaluation (Signed)
Anesthesia Post Note  Patient: Michele Pacheco  Procedure(s) Performed: Procedure(s) (LRB): CESAREAN SECTION (N/A)  Patient location during evaluation: Mother Baby Anesthesia Type: Spinal Level of consciousness: awake Pain management: satisfactory to patient Vital Signs Assessment: post-procedure vital signs reviewed and stable Respiratory status: spontaneous breathing Cardiovascular status: stable Anesthetic complications: no     Last Vitals:  Vitals:   07/09/16 1115 07/09/16 1320  BP: 127/60 122/71  Pulse: 63 64  Resp: 18 18  Temp: 36.7 C 36.7 C    Last Pain:  Vitals:   07/09/16 1320  TempSrc: Oral  PainSc: 2    Pain Goal: Patients Stated Pain Goal: 4 (07/09/16 [id number])               Casimer Lanius

## 2016-07-09 NOTE — Addendum Note (Signed)
Addendum  created 07/09/16 1601 by Asher Muir, CRNA   Sign clinical note

## 2016-07-09 NOTE — Progress Notes (Signed)
While doing orthostatic vitals, upon sitting patient's bp was good; however vs machine did not save and exact numbers lost when bp was checked at standing.

## 2016-07-09 NOTE — Consult Note (Signed)
Neonatology Note:   Attendance at C-section:    I was asked by Dr. Landry Mellow to attend this primary C/S at term. The mother is a G1, GBS negative with good prenatal care. ROM 0 hours before delivery, fluid clear. Infant initially vigorous with good spontaneous cry and tone but then declined.  Brought to warmer and dried and stimulated without good response. Respiratory effort nil; HR trended <100. PPV started with response in HR to >100 and gradual improvement in respiratory effort and tone.  Oropharynx suctioned due to apparent obstruction.  Infant transitioned to blow by then coughed up more mucous and obstructed again.  CPAP given for a couple minutes.  Both nares suctioned and 9cc clear amniotic fluid removed.  Transitioned to blow by and then RA with maintenance of Sao2 in low 90s with regular respiratory rate and mild yet comfortable WOB with clear lungs bilaterally.  Ap 2/7/9. D/w CN staff regarding facilitating skin to skin and transitional monitoring under care of Pediatrician.  D/w parents and father introduced to daughter.   Monia Sabal Katherina Mires, MD

## 2016-07-09 NOTE — H&P (Signed)
Date of Initial H&P: 07/08/2016 History reviewed, patient examined, no change in status, stable for surgery.

## 2016-07-09 NOTE — Lactation Note (Signed)
This note was copied from a baby's chart. Lactation Consultation Note  P1, Baby 95 hours old. Baby has breastfed several times since birth.  Recently bf for 20 min. Mother's left nipple has blister on tip.  Reviewed hand expression and applied drop to nipple along w/ coconut oil. Discussed waiting for wide open gape to achieve a deeper latch. Demonstrated how to prepump to help evert L nipple. Discussed basics.  Mom encouraged to feed baby 8-12 times/24 hours and with feeding cues.  Mom made aware of O/P services, breastfeeding support groups, community resources, and our phone # for post-discharge questions.    Patient Name: Michele Pacheco [id number] Date: 07/09/2016 Reason for consult: Initial assessment   Maternal Data Has patient been taught Hand Expression?: Yes Does the patient have breastfeeding experience prior to this delivery?: No  Feeding Feeding Type: Breast Fed Length of feed: 30 min  LATCH Score/Interventions                      Lactation Tools Discussed/Used     Consult Status Consult Status: Follow-up Date: 07/10/16 Follow-up type: In-patient    Vivianne Master [facility] 07/09/2016, 9:05 PM

## 2016-07-10 ENCOUNTER — Encounter (HOSPITAL_COMMUNITY): Payer: Self-pay | Admitting: Obstetrics and Gynecology

## 2016-07-10 LAB — CBC
HCT: 32.1 % — ABNORMAL LOW (ref 36.0–46.0)
Hemoglobin: 10.9 g/dL — ABNORMAL LOW (ref 12.0–15.0)
MCH: 29 pg (ref 26.0–34.0)
MCHC: 34 g/dL (ref 30.0–36.0)
MCV: 85.4 fL (ref 78.0–100.0)
PLATELETS: 239 10*3/uL (ref 150–400)
RBC: 3.76 MIL/uL — ABNORMAL LOW (ref 3.87–5.11)
RDW: 15.7 % — AB (ref 11.5–15.5)
WBC: 10.4 10*3/uL (ref 4.0–10.5)

## 2016-07-10 NOTE — Progress Notes (Signed)
Subjective: Postop Day 1: Cesarean Delivery No complaints.  Pain controlled.  Lochia normal.  Breast feeding yes.  Objective: Temp:  [98 F (36.7 C)-98.8 F (37.1 C)] 98.8 F (37.1 C) (10/26 0649) Pulse Rate:  [63-74] 73 (10/26 0649) Resp:  [18-20] 18 (10/26 0649) BP: (118-133)/(60-76) 118/62 (10/26 0649) SpO2:  [97 %] 97 % (10/25 1320)  Physical Exam: Gen: NAD Lochia: Not visualized Uterine Fundus: firm, appropriately tender Incision: clean, dry and intact, healing well.  PICO dressing well applied and shrink wrapped.  Orange light on. DVT Evaluation: + Edema present, no calf tenderness bilaterally    Recent Labs  07/08/16 1100 07/10/16 0539  HGB 12.9 10.9*  HCT 37.2 32.1*    Assessment/Plan: Status post C-section-doing well postoperatively. PICO dressing may need to be secured to maintain seal.  Will discuss with RN and review manual. Encouraged ambulation in the hall TID. Dr. Landry Mellow to resume care at 7 am.    Thurnell Lose 07/10/2016, 11:09 AM

## 2016-07-10 NOTE — Lactation Note (Signed)
This note was copied from a baby's chart. Lactation Consultation Note  Patient Name: Michele Pacheco [id number] Date: 07/10/2016   Compression stripes noted bilaterally w/a small open blister on Mom's L nipple. Comfort Gels provided w/instructions for use. Coconut oil removed prior to placement of Coconut Gels.  Mom reports + breast changes w/pregnancy & is interested in having me return to assist w/next latch. Mom has my # to call for assist w/next feeding.  Matthias Hughs [facility] 07/10/2016, 6:40 PM

## 2016-07-11 MED ORDER — IBUPROFEN 600 MG PO TABS: 600.0000 mg | ORAL_TABLET | Freq: Four times a day (QID) | ORAL | 1 refills | Status: DC | PRN

## 2016-07-11 MED ORDER — OXYCODONE HCL 5 MG PO TABS: 5.0000 mg | ORAL_TABLET | ORAL | 0 refills | Status: DC | PRN

## 2016-07-11 NOTE — Lactation Note (Signed)
This note was copied from a baby's chart. Lactation Consultation Note  Patient Name: Michele Pacheco [id number] Date: 07/11/2016    Mom called out for assist w/latch. Infant latches w/relative ease & suckles fervently, but no swallows noted (lack of swallows verified by cervical auscultation). We worked on improving mom's hand expression technique. Mom has my # to call for next feeding.  Matthias Hughs [facility] 07/11/2016, 12:10 AM

## 2016-07-11 NOTE — Lactation Note (Signed)
This note was copied from a baby's chart. Lactation Consultation Note  Mother had abrasion on tip of her R nipple. Oral assessment of infant noted blanching when upper lip is flanged. Assisted mother w/ hand expression.  LC was able to express easy flow of colostrum. Teach back performed and mother was able to express drops and was excited. Assisted w/ latching in cross cradle on L side.  Sucks and swallows observed. Encouraged mother to breastfeed before offering formula and post pump a few times a day particularly on L nipple until she can tolerate feeding. Baby breastfed for 20 min and fell asleep.  69  Mother feeling more confident about breastfeeding. She independently latched baby on L side.  Intermittent sucks and swallows observed. Encouraged to pump R breast after feeding to stimulate. Provided shells and suggest alternating between shells and comfort gels until nipple heals. Do not wear shells more than 2 weeks. OP appointment set up for 10/31. Provided volume guidelines for formula but encouraged mother to use her own breastmilk when available. Reviewed engorgement care and monitoring voids/stools.    Patient Name: Girl Eniya Cazier [id number] Date: 07/11/2016 Reason for consult: Follow-up assessment   Maternal Data    Feeding Feeding Type: Breast Fed Length of feed: 20 min  LATCH Score/Interventions Latch: Grasps breast easily, tongue down, lips flanged, rhythmical sucking.  Audible Swallowing: A few with stimulation Intervention(s): Skin to skin  Type of Nipple: Everted at rest and after stimulation  Comfort (Breast/Nipple): Filling, red/small blisters or bruises, mild/mod discomfort  Problem noted: Mild/Moderate discomfort Interventions  (Cracked/bleeding/bruising/blister): Expressed breast milk to nipple;Double electric pump Interventions (Mild/moderate discomfort): Comfort gels (shells)  Hold (Positioning): Assistance needed to correctly position  infant at breast and maintain latch.  LATCH Score: 7  Lactation Tools Discussed/Used     Consult Status Consult Status: Follow-up Date: 07/12/16 Follow-up type: In-patient    Vivianne Master [facility] 07/11/2016, 12:29 PM

## 2016-07-11 NOTE — Discharge Summary (Signed)
OB Discharge Summary     Patient Name: Michele Pacheco DOB: [birth year] MRN: [id number]  Date of admission: 07/09/2016 Delivering MD: Michele Pacheco   Date of discharge: 07/11/2016  Admitting diagnosis: ctx D26.0 Fibroid of the Cervix and cephalopic disproportion  Intrauterine pregnancy: [redacted]w[redacted]d     Secondary diagnosis:  Active Problems:   S/P cesarean section  Additional problems: morbid obesity      Discharge diagnosis: Term Pregnancy Delivered                                                                                                Post partum procedures:None  Augmentation: NA  Complications: None  Hospital course:  Sceduled C/S   [name] at [redacted]w[redacted]d was admitted to the hospital 07/09/2016 for scheduled cesarean section with the following indication:cephalopelvic disproportion and cervical fibroid .  Membrane Rupture Time/Date: 8:03 AM ,07/09/2016   Patient delivered a Viable infant.07/09/2016  Details of operation can be found in separate operative note.  Pateint had an uncomplicated postpartum course.  She is ambulating, tolerating a regular diet, passing flatus, and urinating well. Patient is discharged home in stable condition on  07/11/16          Physical exam Vitals:   07/10/16 0300 07/10/16 0649 07/10/16 1758 07/11/16 0559  BP: 127/70 118/62 123/62 122/62  Pulse: 67 73 77 80  Resp: 18 18 18 18   Temp: 98.7 F (37.1 C) 98.8 F (37.1 C) 98.9 F (37.2 C) 98.2 F (36.8 C)  TempSrc: Oral Oral Oral Oral  SpO2:      Weight:      Height:       General: alert, cooperative and no distress Lochia: appropriate Uterine Fundus: firm Incision: Dressing is clean, dry, and intact DVT Evaluation: No evidence of DVT seen on physical exam. Labs: Lab Results  Component Value Date   WBC 10.4 07/10/2016   HGB 10.9 (L) 07/10/2016   HCT 32.1 (L) 07/10/2016   MCV 85.4 07/10/2016   PLT 239 07/10/2016   No flowsheet data found.  Discharge instruction: per  After Visit Summary and "Baby and Me Booklet".  After visit meds:    Medication List    STOP taking these medications   DHA PO     TAKE these medications   ibuprofen 600 MG tablet Commonly known as:  ADVIL,MOTRIN Take 1 tablet (600 mg total) by mouth every 6 (six) hours as needed for mild pain or moderate pain.   oxyCODONE 5 MG immediate release tablet Commonly known as:  Oxy IR/ROXICODONE Take 1 tablet (5 mg total) by mouth every 4 (four) hours as needed for severe pain (pain scale 4-7).   prenatal multivitamin Tabs tablet Take 1 tablet by mouth at bedtime.       Diet: routine diet  Activity: Advance as tolerated. Pelvic rest for 6 weeks.   Outpatient follow up:2 weeks Follow up Appt:Future Appointments Date Time Provider [place]  07/18/2016 10:30 AM [place] None   Follow up Visit:No Follow-up on file.  Postpartum contraception: Not Discussed  Newborn Data: Live born  female  Birth Weight: 7 lb 8.8 oz (3425 g) APGAR: 2, 7  Baby Feeding: Breast Disposition:home with mother   07/11/2016 Michele Pacheco., MD

## 2016-07-11 NOTE — Lactation Note (Signed)
This note was copied from a baby's chart. Lactation Consultation Note  Patient Name: Michele Pacheco OINOM'V Date: 07/11/2016   Infant frantic when I entered room. Per mom, infant had fed for 45 minutes about an hour ago. Infant was too frantic to latch & would not even suck on my gloved finger. Hand expression from our last visit had not yielded enough for supplementing. Mom amenable to supplementation w/formula. Infant was cup-fed w/ease & took a total of 80m. Dad successfully did the last portion of the cup feeding & feels confident in cup-feeding when needed.   Mom shown how to use DEBP & how to disassemble, clean, & reassemble parts. Putting a small amount of coconut oil inside the flanges made it more comfortable for Mom. The size 24 flange is an appropriate fit for her at this time. Mom shown how to assemble & use hand pump that was included in pump kit.   Plan: 1. Breastfeed infant. 2. Cup-feed w/EBM or formula after feeding, if infant still acting hungry. Cup-feed to satiety.  3. Use the DEBP whenever formula is given.   Parents verbalized understanding & RNs aware.   RMatthias HughsHTexas Health Surgery Center Irving10/27/2017, 12:12 AM

## 2016-07-18 ENCOUNTER — Ambulatory Visit (HOSPITAL_COMMUNITY): Payer: BLUE CROSS/BLUE SHIELD

## 2016-07-25 ENCOUNTER — Encounter (HOSPITAL_COMMUNITY): Payer: No Typology Code available for payment source

## 2016-07-29 ENCOUNTER — Encounter (HOSPITAL_COMMUNITY): Payer: BLUE CROSS/BLUE SHIELD

## 2016-07-31 ENCOUNTER — Encounter (HOSPITAL_COMMUNITY): Payer: BLUE CROSS/BLUE SHIELD

## 2016-07-31 ENCOUNTER — Encounter (HOSPITAL_COMMUNITY)
Admission: RE | Admit: 2016-07-31 | Discharge: 2016-07-31 | Disposition: A | Discharge status: Home / Self Care | Payer: Managed Care, Other (non HMO) | Source: Ambulatory Visit | Attending: Obstetrics and Gynecology | Admitting: Obstetrics and Gynecology

## 2016-07-31 NOTE — Lactation Note (Signed)
Lactation Consult: weight today 4088 g  9# 0.2 oz. Mom reports baby has been nursing well she just wants to make sure it is going well, Great weight gain, plenty diaper changes. No pain with nursing. Mom easily latched baby by herself. Is pumping some and dad is bottle feeding once at night so mom can rest. Praise given for her efforts. Reviewed [facility] as another resources for support if needed. No questions at present. To call prn  Mother's reason for visit:  To make sure breast feeding is going ok Visit Type:  Feeding assessment Appointment Notes:  Has trouble with latch in hospital Consult:  Initial Lactation Consultant:  Truddie Crumble  ________________________________________________________________________ 42 Name:  Michele Pacheco Date of Birth:  07/09/2016 Pediatrician:  Abner Greenspan Gender:  female Gestational Age: [redacted]w[redacted]d (At Birth) Birth Weight:  7 lb 8.8 oz (3425 g) Weight at Discharge:  Weight: 7 lb 1.6 oz (3220 g) (new scale)                   Date of Discharge:  07/11/2016      Filed Weights   07/09/16 0804 07/09/16 2350 07/10/16 2315  Weight: 7 lb 8.8 oz (3425 g) 7 lb 6.5 oz (3360 g) 7 lb 1.6 oz (3220 g)      ________________________________________________________________________  Mother's Name: Michele Pacheco Breastfeeding Experience:  P1 ________________________________________________________________________  Breastfeeding History (Post Discharge)  Frequency of breastfeeding:  q 2-3 hours, sometimes more often Duration of feeding:  10 min  Supplementation  Formula:  Volume 0 ml    Breastmilk:  Volume  2 ozl Frequency:  Once at night  Method:  Bottle,   Pumping  Type of pump:  Specta Frequency:  1-3 times/day Volume:  5+ oz    Infant Intake and Output Assessment  Voids:  10 + in 24 hrs.  Color:  Clear yellow had 2 voids and 1 small yellow stool while here for appointment Stools:  10+ in 24 hrs.  Color:   Yellow  ________________________________________________________________________  Maternal Breast Assessment  Breast:  Filling Nipple:  Erect  _______________________________________________________________________ Feeding Assessment/Evaluation  Initial feeding assessment:  Infant's oral assessment:  WNL  Positioning:  Cross cradle Right breast  LATCH documentation:  Latch:  2 = Grasps breast easily, tongue down, lips flanged, rhythmical sucking.  Audible swallowing:  2 = Spontaneous and intermittent  Type of nipple:  2 = Everted at rest and after stimulation  Comfort (Breast/Nipple):  2 = Soft / non-tender  Hold (Positioning):  2 = No assistance needed to correctly position infant at breast  LATCH score:  10  Attached assessment:  Deep  Lips flanged:  Yes.    Lips untucked:  Yes.    Suck assessment:  Nutritive  Pre-feed weight:  4088 g 9# 0.2 oz Post-feed weight: 4155 g  9# 2.2 oz Amount transferred:  56 ml Amount supplemented:  0 ml   Total amount pumped post feed: Mom did not bring pump with her  Total amount transferred:  56  ml Total supplement given:  0 ml

## 2017-07-09 DIAGNOSIS — Z23 Encounter for immunization: Secondary | ICD-10-CM | POA: Diagnosis not present

## 2018-05-31 DIAGNOSIS — Z3400 Encounter for supervision of normal first pregnancy, unspecified trimester: Secondary | ICD-10-CM | POA: Diagnosis not present

## 2018-05-31 DIAGNOSIS — Z3201 Encounter for pregnancy test, result positive: Secondary | ICD-10-CM | POA: Diagnosis not present

## 2018-05-31 DIAGNOSIS — Z3401 Encounter for supervision of normal first pregnancy, first trimester: Secondary | ICD-10-CM | POA: Diagnosis not present

## 2018-06-02 DIAGNOSIS — O200 Threatened abortion: Secondary | ICD-10-CM | POA: Diagnosis not present

## 2018-06-02 DIAGNOSIS — O209 Hemorrhage in early pregnancy, unspecified: Secondary | ICD-10-CM | POA: Diagnosis not present

## 2018-06-04 DIAGNOSIS — O200 Threatened abortion: Secondary | ICD-10-CM | POA: Diagnosis not present

## 2018-06-11 DIAGNOSIS — O200 Threatened abortion: Secondary | ICD-10-CM | POA: Diagnosis not present

## 2018-06-16 DIAGNOSIS — O200 Threatened abortion: Secondary | ICD-10-CM | POA: Diagnosis not present

## 2018-06-22 DIAGNOSIS — O039 Complete or unspecified spontaneous abortion without complication: Secondary | ICD-10-CM | POA: Diagnosis not present

## 2018-09-27 DIAGNOSIS — J111 Influenza due to unidentified influenza virus with other respiratory manifestations: Secondary | ICD-10-CM | POA: Diagnosis not present

## 2018-10-11 ENCOUNTER — Ambulatory Visit (HOSPITAL_COMMUNITY)
Admission: EM | Admit: 2018-10-11 | Discharge: 2018-10-11 | Disposition: A | Discharge status: Home / Self Care | Payer: Worker's Compensation | Attending: Family Medicine | Admitting: Family Medicine

## 2018-10-11 ENCOUNTER — Ambulatory Visit (INDEPENDENT_AMBULATORY_CARE_PROVIDER_SITE_OTHER): Payer: Worker's Compensation

## 2018-10-11 ENCOUNTER — Encounter (HOSPITAL_COMMUNITY): Payer: Self-pay | Admitting: Emergency Medicine

## 2018-10-11 DIAGNOSIS — S92354A Nondisplaced fracture of fifth metatarsal bone, right foot, initial encounter for closed fracture: Secondary | ICD-10-CM

## 2018-10-11 NOTE — ED Triage Notes (Signed)
Pt here for right foot pain after dest was dropped on foot today at work

## 2018-10-11 NOTE — Discharge Instructions (Signed)
X-ray revealed a fracture of the fifth metatarsal We will put you in a postop shoe to wear He can take ibuprofen as needed for pain inflammation Rest, ice, elevate Follow-up with Ortho for further management

## 2018-10-12 NOTE — ED Provider Notes (Signed)
[facility]    CSN: [id number] Arrival date & time: 10/11/18  [place]     History   Chief Complaint Chief Complaint  Patient presents with  . Foot Pain    HPI Michele Pacheco is a 33 y.o. female.   Pt is a 33 year old female that presents with right foot pain and swelling after she dropped a desk on her foot earlier today.  Symptoms have been constant with more swelling.  She is having the most pain near the base of the fifth metatarsal.  She has good range of motion of the toes.  She has good range of motion of the ankle.  There is mild tenderness to palpation.  No erythema.  No fever, chills.  She has not taken anything for pain.  She has not applied any ice to the area.  ROS per HPI      Past Medical History:  Diagnosis Date  . Family history of adverse reaction to anesthesia    mother has nausea/vomiting after anesthesia  . Fibroid     Patient Active Problem List   Diagnosis Date Noted  . S/P cesarean section 07/09/2016    Past Surgical History:  Procedure Laterality Date  . CESAREAN SECTION N/A 07/09/2016   Procedure: CESAREAN SECTION;  Surgeon: Christophe Louis, MD;  Location: [facility];  Service: Obstetrics;  Laterality: N/A;  RNFA Duffy Rhody confirmed for case  . KNEE SURGERY      OB History    Gravida  1   Para  1   Term  1   Preterm      AB      Living  1     SAB      TAB      Ectopic      Multiple  0   Live Births  1            Home Medications    Prior to Admission medications   Medication Sig Start Date End Date Taking? Authorizing Provider  ibuprofen (ADVIL,MOTRIN) 600 MG tablet Take 1 tablet (600 mg total) by mouth every 6 (six) hours as needed for mild pain or moderate pain. 07/11/16   Christophe Louis, MD  oxyCODONE (OXY IR/ROXICODONE) 5 MG immediate release tablet Take 1 tablet (5 mg total) by mouth every 4 (four) hours as needed for severe pain (pain scale 4-7). Patient not taking: Reported on  10/11/2018 07/11/16   Christophe Louis, MD  Prenatal Vit-Fe Fumarate-FA (PRENATAL MULTIVITAMIN) TABS tablet Take 1 tablet by mouth at bedtime.    [provider]    Family History Family History  Problem Relation Age of Onset  . Diabetes Mother   . Hypertension Mother   . Hypertension Father   . Hyperlipidemia Father   . Diabetes Maternal Grandfather   . Hypertension Paternal Grandmother   . COPD Paternal Grandfather        emphysema    Social History Social History   Tobacco Use  . Smoking status: Never Smoker  . Smokeless tobacco: Never Used  Substance Use Topics  . Alcohol use: Yes    Comment: occa  . Drug use: No     Allergies   Vitamin c   Review of Systems Review of Systems   Physical Exam Triage Vital Signs ED Triage Vitals [10/11/18 1922]  Enc Vitals Group     BP 125/85     Pulse Rate 83     Resp 18     Temp  97.7 F (36.5 C)     Temp Source Temporal     SpO2 98 %     Weight      Height      Head Circumference      Peak Flow      Pain Score 5     Pain Loc      Pain Edu?      Excl. in [place]?    No data found.  Updated Vital Signs BP 125/85 (BP Location: Right Arm)   Pulse 83   Temp 97.7 F (36.5 C) (Temporal)   Resp 18   LMP 10/05/2018 (Exact Date)   SpO2 98%   Visual Acuity Right Eye Distance:   Left Eye Distance:   Bilateral Distance:    Right Eye Near:   Left Eye Near:    Bilateral Near:     Physical Exam Vitals signs and nursing note reviewed.  Constitutional:      General: She is not in acute distress.    Appearance: She is well-developed.  HENT:     Head: Normocephalic and atraumatic.  Eyes:     Conjunctiva/sclera: Conjunctivae normal.  Neck:     Musculoskeletal: Neck supple.  Cardiovascular:     Rate and Rhythm: Normal rate and regular rhythm.     Heart sounds: No murmur.  Pulmonary:     Effort: Pulmonary effort is normal. No respiratory distress.     Breath sounds: Normal breath sounds.  Abdominal:      Palpations: Abdomen is soft.     Tenderness: There is no abdominal tenderness.  Musculoskeletal:        General: Swelling, tenderness and signs of injury present.     Comments: Swelling, tenderness to the dorsum of the foot surrounding the fourth and fifth metatarsals mostly at the base. Patient able to wiggle all of her toes.  Sensation and pedal pulse intact.  Skin:    General: Skin is warm and dry.  Neurological:     Mental Status: She is alert.      UC Treatments / Results  Labs (all labs ordered are listed, but only abnormal results are displayed) Labs Reviewed - No data to display  EKG None  Radiology Dg Foot Complete Right  Result Date: 10/11/2018 CLINICAL DATA:  Dropped heavy object on foot. EXAM: RIGHT FOOT COMPLETE - 3+ VIEW COMPARISON:  None. FINDINGS: Probable old healed fracture through the proximal shaft of the right 5th metatarsal with sclerosis. No acute fracture, subluxation or dislocation. Soft tissues are intact. Joint spaces maintained. IMPRESSION: Probable old healed proximal right 5th metatarsal fracture. No acute bony abnormality. Electronically Signed   By: Rolm Baptise M.D.   On: 10/11/2018 19:48    Procedures Procedures (including critical care time)  Medications Ordered in UC Medications - No data to display  Initial Impression / Assessment and Plan / UC Course  I have reviewed the triage vital signs and the nursing notes.  Pertinent labs & imaging results that were available during my care of the patient were reviewed by me and considered in my medical decision making (see chart for details).     X-ray revealed what appears to be an old fifth metatarsal fracture.  Patient reports that she has never injured her foot in the past. This is most likely an acute fracture. We will go ahead and treat as this. Will place patient in a postop shoe. Instructed to rest, ice, elevate and take ibuprofen for pain and inflammation Follow-up  with orthopedic for  further evaluation and management  Final Clinical Impressions(s) / UC Diagnoses   Final diagnoses:  Closed nondisplaced fracture of fifth metatarsal bone of right foot, initial encounter     Discharge Instructions     X-ray revealed a fracture of the fifth metatarsal We will put you in a postop shoe to wear He can take ibuprofen as needed for pain inflammation Rest, ice, elevate Follow-up with Ortho for further management    ED Prescriptions    None     Controlled Substance Prescriptions [place] Controlled Substance Registry consulted? no   Orvan July, NP 10/12/18 1003

## 2019-01-07 DIAGNOSIS — Z3480 Encounter for supervision of other normal pregnancy, unspecified trimester: Secondary | ICD-10-CM | POA: Diagnosis not present

## 2019-01-07 LAB — OB RESULTS CONSOLE GC/CHLAMYDIA
Chlamydia: NEGATIVE
Gonorrhea: NEGATIVE

## 2019-01-07 LAB — OB RESULTS CONSOLE ABO/RH: RH Type: POSITIVE

## 2019-01-07 LAB — OB RESULTS CONSOLE RPR: RPR: NONREACTIVE

## 2019-01-07 LAB — OB RESULTS CONSOLE HEPATITIS B SURFACE ANTIGEN: Hepatitis B Surface Ag: NEGATIVE

## 2019-01-07 LAB — OB RESULTS CONSOLE ANTIBODY SCREEN: Antibody Screen: NEGATIVE

## 2019-01-07 LAB — OB RESULTS CONSOLE RUBELLA ANTIBODY, IGM: Rubella: IMMUNE

## 2019-01-11 ENCOUNTER — Other Ambulatory Visit (HOSPITAL_COMMUNITY)
Admission: RE | Admit: 2019-01-11 | Discharge: 2019-01-11 | Disposition: A | Discharge status: Home / Self Care | Payer: BLUE CROSS/BLUE SHIELD | Source: Ambulatory Visit | Attending: Obstetrics and Gynecology | Admitting: Obstetrics and Gynecology

## 2019-01-11 ENCOUNTER — Other Ambulatory Visit: Payer: Self-pay | Admitting: Obstetrics and Gynecology

## 2019-01-11 DIAGNOSIS — Z3481 Encounter for supervision of other normal pregnancy, first trimester: Secondary | ICD-10-CM | POA: Diagnosis not present

## 2019-01-11 DIAGNOSIS — Z124 Encounter for screening for malignant neoplasm of cervix: Secondary | ICD-10-CM | POA: Diagnosis not present

## 2019-01-17 DIAGNOSIS — O99211 Obesity complicating pregnancy, first trimester: Secondary | ICD-10-CM | POA: Diagnosis not present

## 2019-01-24 LAB — CYTOLOGY - PAP
Chlamydia: NEGATIVE
Diagnosis: NEGATIVE
HPV: NOT DETECTED
Neisseria Gonorrhea: NEGATIVE

## 2019-03-08 DIAGNOSIS — Z3482 Encounter for supervision of other normal pregnancy, second trimester: Secondary | ICD-10-CM | POA: Diagnosis not present

## 2019-03-08 DIAGNOSIS — Z36 Encounter for antenatal screening for chromosomal anomalies: Secondary | ICD-10-CM | POA: Diagnosis not present

## 2019-03-08 DIAGNOSIS — Z3490 Encounter for supervision of normal pregnancy, unspecified, unspecified trimester: Secondary | ICD-10-CM | POA: Diagnosis not present

## 2019-07-15 LAB — OB RESULTS CONSOLE GBS: GBS: NEGATIVE

## 2019-07-26 ENCOUNTER — Encounter (HOSPITAL_COMMUNITY): Payer: Self-pay | Admitting: *Deleted

## 2019-07-26 NOTE — Patient Instructions (Signed)
Michele Pacheco  07/26/2019   Your procedure is scheduled on:  08/01/2019  Arrive at [facility] at [employer] [facility] on [address] at [facility]  and [address]. You are invited to use the FREE valet parking or use the Visitor's parking deck.  Pick up the phone at the desk and dial [phone].  Call this number if you have problems the morning of surgery: [phone]  Remember:   Do not eat food:(After Midnight) Desps de medianoche.  Do not drink clear liquids: (After Midnight) Desps de medianoche.  Take these medicines the morning of surgery with A SIP OF WATER:  none   Do not wear jewelry, make-up or nail polish.  Do not wear lotions, powders, or perfumes. Do not wear deodorant.  Do not shave 48 hours prior to surgery.  Do not bring valuables to the hospital.  [facility] is not   responsible for any belongings or valuables brought to the hospital.  Contacts, dentures or bridgework may not be worn into surgery.  Leave suitcase in the car. After surgery it may be brought to your room.  For patients admitted to the hospital, checkout time is 11:00 AM the day of              discharge.      Please read over the following fact sheets that you were given:     Preparing for Surgery

## 2019-07-30 ENCOUNTER — Other Ambulatory Visit: Payer: Self-pay

## 2019-07-30 ENCOUNTER — Other Ambulatory Visit: Payer: Self-pay | Admitting: Obstetrics and Gynecology

## 2019-07-30 ENCOUNTER — Other Ambulatory Visit (HOSPITAL_COMMUNITY)
Admission: RE | Admit: 2019-07-30 | Discharge: 2019-07-30 | Disposition: A | Discharge status: Home / Self Care | Payer: Managed Care, Other (non HMO) | Source: Ambulatory Visit | Attending: Obstetrics and Gynecology | Admitting: Obstetrics and Gynecology

## 2019-07-30 DIAGNOSIS — Z20828 Contact with and (suspected) exposure to other viral communicable diseases: Secondary | ICD-10-CM | POA: Insufficient documentation

## 2019-07-30 LAB — CBC
HCT: 37.2 % (ref 36.0–46.0)
Hemoglobin: 12.4 g/dL (ref 12.0–15.0)
MCH: 29.4 pg (ref 26.0–34.0)
MCHC: 33.3 g/dL (ref 30.0–36.0)
MCV: 88.2 fL (ref 80.0–100.0)
Platelets: 257 10*3/uL (ref 150–400)
RBC: 4.22 MIL/uL (ref 3.87–5.11)
RDW: 15.2 % (ref 11.5–15.5)
WBC: 10.9 10*3/uL — ABNORMAL HIGH (ref 4.0–10.5)
nRBC: 0 % (ref 0.0–0.2)

## 2019-07-30 LAB — RPR: RPR Ser Ql: NONREACTIVE

## 2019-07-30 LAB — TYPE AND SCREEN
ABO/RH(D): B POS
Antibody Screen: NEGATIVE

## 2019-07-30 LAB — ABO/RH: ABO/RH(D): B POS

## 2019-07-30 LAB — SARS CORONAVIRUS 2 (TAT 6-24 HRS): SARS Coronavirus 2: NEGATIVE

## 2019-07-30 NOTE — H&P (Signed)
Michele Pacheco is a 33 y.o. female at 91 wks and 3 days presenting for repeat cesarean section. Pregnancy is complicated by cervical fibroid and h/o cesarean section. Prenatal care provided by Dr. Christophe Louis with [facility].   . OB History    Gravida  2   Para  1   Term  1   Preterm      AB      Living  1     SAB      TAB      Ectopic      Multiple  0   Live Births  1          Past Medical History:  Diagnosis Date  . Family history of adverse reaction to anesthesia    mother has nausea/vomiting after anesthesia  . Fibroid    Past Surgical History:  Procedure Laterality Date  . CESAREAN SECTION N/A 07/09/2016   Procedure: CESAREAN SECTION;  Surgeon: Christophe Louis, MD;  Location: [facility];  Service: Obstetrics;  Laterality: N/A;  RNFA Duffy Rhody confirmed for case  . KNEE SURGERY     Family History: family history includes Breast cancer in her maternal aunt and paternal aunt; COPD in her paternal grandfather; Diabetes in her maternal grandfather and mother; Hyperlipidemia in her father; Hypertension in her father, mother, and paternal grandmother; Kidney disease in her mother. Social History:  reports that she has never smoked. She has never used smokeless tobacco. She reports current alcohol use. She reports that she does not use drugs.     Maternal Diabetes: No Genetic Screening: Declined Maternal Ultrasounds/Referrals: Normal Fetal Ultrasounds or other Referrals:  None Maternal Substance Abuse:  No Significant Maternal Medications:  None Significant Maternal Lab Results:  Group B Strep negative Other Comments:  None  Review of Systems  Constitutional: Negative.   HENT: Negative.   Eyes: Negative.   Respiratory: Negative.   Cardiovascular: Negative.   Gastrointestinal: Negative.   Genitourinary: Negative.   Musculoskeletal: Negative.   Skin: Negative.   Neurological: Negative.   Endo/Heme/Allergies: Negative.    Psychiatric/Behavioral: Negative.    History   Last menstrual period 10/29/2018, unknown if currently breastfeeding. Exam Physical Exam  Vitals reviewed. Constitutional: She is oriented to person, place, and time. She appears well-developed and well-nourished.  HENT:  Head: Normocephalic and atraumatic.  Eyes: Pupils are equal, round, and reactive to light. Conjunctivae are normal.  Neck: Normal range of motion. Neck supple.  Cardiovascular: Normal rate and regular rhythm.  Respiratory: Effort normal and breath sounds normal.  GI: There is no abdominal tenderness.  Genitourinary:    Vagina normal.   Musculoskeletal: Normal range of motion.        General: Edema present.  Neurological: She is alert and oriented to person, place, and time. She has normal reflexes.  Skin: Skin is warm and dry.  Psychiatric: She has a normal mood and affect.    Prenatal labs: ABO, Rh: --/--/B POS (11/14 0848) Antibody: NEG (11/14 0848) Rubella: Immune (04/24 0000) RPR: Nonreactive (04/24 0000)  HBsAg: Negative (04/24 0000)  HIV:   Negative  GBS: Negative/-- (10/30 0000)   Assessment/Plan: 39 wks and 3 days with h/o cesarean section and cervical fibroid. R/B/A of cesarean section discussed with patient including but not limited to infection bleeding damage to bowel bladder and baby with the need for further surgery. R/o transfusion discussed. Pt voiced understanding and desires to proceed with repeat cesarean section due to h/o same and cervical fibroid.  Christophe Louis 07/30/2019, 10:11 AM

## 2019-07-30 NOTE — MAU Note (Signed)
Pt here for PAT covid swab and lab draw. Denies symptoms. Swab collected. Verbalizes understanding to not remove blood bank bracelet.

## 2019-08-01 ENCOUNTER — Inpatient Hospital Stay (HOSPITAL_COMMUNITY)
Admission: AD | Admit: 2019-08-01 | Discharge: 2019-08-04 | DRG: 788 | Disposition: A | Discharge status: Home / Self Care | Payer: Managed Care, Other (non HMO) | Attending: Obstetrics and Gynecology | Admitting: Obstetrics and Gynecology

## 2019-08-01 ENCOUNTER — Encounter (HOSPITAL_COMMUNITY): Payer: Self-pay | Admitting: General Practice

## 2019-08-01 ENCOUNTER — Inpatient Hospital Stay (HOSPITAL_COMMUNITY): Payer: Managed Care, Other (non HMO) | Admitting: Anesthesiology

## 2019-08-01 ENCOUNTER — Encounter (HOSPITAL_COMMUNITY)
Admission: AD | Disposition: A | Discharge status: Home / Self Care | Payer: Self-pay | Source: Home / Self Care | Attending: Obstetrics and Gynecology

## 2019-08-01 ENCOUNTER — Other Ambulatory Visit: Payer: Self-pay

## 2019-08-01 DIAGNOSIS — O3443 Maternal care for other abnormalities of cervix, third trimester: Secondary | ICD-10-CM | POA: Diagnosis present

## 2019-08-01 DIAGNOSIS — O99214 Obesity complicating childbirth: Secondary | ICD-10-CM | POA: Diagnosis present

## 2019-08-01 DIAGNOSIS — Z3A39 39 weeks gestation of pregnancy: Secondary | ICD-10-CM | POA: Diagnosis not present

## 2019-08-01 DIAGNOSIS — Z98891 History of uterine scar from previous surgery: Secondary | ICD-10-CM

## 2019-08-01 DIAGNOSIS — Z20828 Contact with and (suspected) exposure to other viral communicable diseases: Secondary | ICD-10-CM | POA: Diagnosis present

## 2019-08-01 DIAGNOSIS — O34211 Maternal care for low transverse scar from previous cesarean delivery: Secondary | ICD-10-CM | POA: Diagnosis present

## 2019-08-01 DIAGNOSIS — D260 Other benign neoplasm of cervix uteri: Secondary | ICD-10-CM | POA: Diagnosis present

## 2019-08-01 SURGERY — Surgical Case
Anesthesia: Spinal | Site: Abdomen | Wound class: Clean Contaminated

## 2019-08-01 MED ORDER — NALBUPHINE HCL 10 MG/ML IJ SOLN
5.0000 mg | INTRAMUSCULAR | Status: DC | PRN
  Filled 2019-08-01: qty 0.5

## 2019-08-01 MED ORDER — BUPIVACAINE IN DEXTROSE 0.75-8.25 % IT SOLN
INTRATHECAL | Status: DC | PRN
  Administered 2019-08-01: 1.8 mL via INTRATHECAL

## 2019-08-01 MED ORDER — MORPHINE SULFATE (PF) 0.5 MG/ML IJ SOLN
INTRAMUSCULAR | Status: DC | PRN
  Administered 2019-08-01: 150 ug via INTRATHECAL

## 2019-08-01 MED ORDER — SIMETHICONE 80 MG PO CHEW
80.0000 mg | CHEWABLE_TABLET | Freq: Three times a day (TID) | ORAL | Status: DC
  Administered 2019-08-01 – 2019-08-04 (×9): 80 mg via ORAL
  Filled 2019-08-01 (×9): qty 1

## 2019-08-01 MED ORDER — DIPHENHYDRAMINE HCL 25 MG PO CAPS: 25.0000 mg | ORAL_CAPSULE | Freq: Four times a day (QID) | ORAL | Status: DC | PRN

## 2019-08-01 MED ORDER — METOCLOPRAMIDE HCL 5 MG/ML IJ SOLN
INTRAMUSCULAR | Status: AC
  Filled 2019-08-01: qty 2

## 2019-08-01 MED ORDER — FERROUS SULFATE 325 (65 FE) MG PO TABS
325.0000 mg | ORAL_TABLET | Freq: Two times a day (BID) | ORAL | Status: DC
  Administered 2019-08-01 – 2019-08-04 (×6): 325 mg via ORAL
  Filled 2019-08-01 (×6): qty 1

## 2019-08-01 MED ORDER — DEXAMETHASONE SODIUM PHOSPHATE 4 MG/ML IJ SOLN: INTRAMUSCULAR | Status: DC | PRN

## 2019-08-01 MED ORDER — PHENYLEPHRINE 40 MCG/ML (10ML) SYRINGE FOR IV PUSH (FOR BLOOD PRESSURE SUPPORT)
PREFILLED_SYRINGE | INTRAVENOUS | Status: AC
  Filled 2019-08-01: qty 10

## 2019-08-01 MED ORDER — METHYLERGONOVINE MALEATE 0.2 MG PO TABS: 0.2000 mg | ORAL_TABLET | ORAL | Status: DC | PRN

## 2019-08-01 MED ORDER — SIMETHICONE 80 MG PO CHEW
80.0000 mg | CHEWABLE_TABLET | ORAL | Status: DC
  Administered 2019-08-01 – 2019-08-03 (×3): 80 mg via ORAL
  Filled 2019-08-01 (×3): qty 1

## 2019-08-01 MED ORDER — SOD CITRATE-CITRIC ACID 500-334 MG/5ML PO SOLN
30.0000 mL | ORAL | Status: AC
  Administered 2019-08-01: 30 mL via ORAL

## 2019-08-01 MED ORDER — MORPHINE SULFATE (PF) 0.5 MG/ML IJ SOLN
INTRAMUSCULAR | Status: AC
  Filled 2019-08-01: qty 10

## 2019-08-01 MED ORDER — ACETAMINOPHEN 10 MG/ML IV SOLN
INTRAVENOUS | Status: AC
  Filled 2019-08-01: qty 100

## 2019-08-01 MED ORDER — DIPHENHYDRAMINE HCL 50 MG/ML IJ SOLN: 12.5000 mg | INTRAMUSCULAR | Status: DC | PRN

## 2019-08-01 MED ORDER — OXYTOCIN 40 UNITS IN NORMAL SALINE INFUSION - SIMPLE MED
INTRAVENOUS | Status: AC
  Filled 2019-08-01: qty 1000

## 2019-08-01 MED ORDER — NALBUPHINE HCL 10 MG/ML IJ SOLN
5.0000 mg | Freq: Once | INTRAMUSCULAR | Status: DC | PRN
  Filled 2019-08-01: qty 0.5

## 2019-08-01 MED ORDER — FENTANYL CITRATE (PF) 100 MCG/2ML IJ SOLN
INTRAMUSCULAR | Status: DC | PRN
  Administered 2019-08-01: 15 ug via INTRATHECAL

## 2019-08-01 MED ORDER — ENOXAPARIN SODIUM 80 MG/0.8ML ~~LOC~~ SOLN: 0.5000 mg/kg | SUBCUTANEOUS | Status: DC

## 2019-08-01 MED ORDER — DEXTROSE 5 % IV SOLN
3.0000 g | INTRAVENOUS | Status: AC
  Administered 2019-08-01: 20 g via INTRAVENOUS

## 2019-08-01 MED ORDER — NALOXONE HCL 0.4 MG/ML IJ SOLN: 0.4000 mg | INTRAMUSCULAR | Status: DC | PRN

## 2019-08-01 MED ORDER — STERILE WATER FOR IRRIGATION IR SOLN
Status: DC | PRN
  Administered 2019-08-01: 1000 mL

## 2019-08-01 MED ORDER — METOCLOPRAMIDE HCL 5 MG/ML IJ SOLN
INTRAMUSCULAR | Status: DC | PRN
  Administered 2019-08-01: 10 mg via INTRAVENOUS

## 2019-08-01 MED ORDER — HYDROMORPHONE HCL 1 MG/ML IJ SOLN: 0.2000 mg | INTRAMUSCULAR | Status: DC | PRN

## 2019-08-01 MED ORDER — OXYTOCIN 40 UNITS IN NORMAL SALINE INFUSION - SIMPLE MED: 2.5000 [IU]/h | INTRAVENOUS | Status: AC

## 2019-08-01 MED ORDER — SCOPOLAMINE 1 MG/3DAYS TD PT72
MEDICATED_PATCH | TRANSDERMAL | Status: AC
  Filled 2019-08-01: qty 1

## 2019-08-01 MED ORDER — ONDANSETRON HCL 4 MG/2ML IJ SOLN: 4.0000 mg | Freq: Three times a day (TID) | INTRAMUSCULAR | Status: DC | PRN

## 2019-08-01 MED ORDER — ONDANSETRON HCL 4 MG/2ML IJ SOLN
INTRAMUSCULAR | Status: AC
  Filled 2019-08-01: qty 2

## 2019-08-01 MED ORDER — PHENYLEPHRINE HCL-NACL 20-0.9 MG/250ML-% IV SOLN
INTRAVENOUS | Status: DC | PRN
  Administered 2019-08-01: 60 ug/min via INTRAVENOUS

## 2019-08-01 MED ORDER — KETOROLAC TROMETHAMINE 30 MG/ML IJ SOLN
INTRAMUSCULAR | Status: AC
  Filled 2019-08-01: qty 1

## 2019-08-01 MED ORDER — OXYTOCIN 10 UNIT/ML IJ SOLN
INTRAMUSCULAR | Status: DC | PRN
  Administered 2019-08-01: 40 [IU]

## 2019-08-01 MED ORDER — FENTANYL CITRATE (PF) 100 MCG/2ML IJ SOLN: 25.0000 ug | INTRAMUSCULAR | Status: DC | PRN

## 2019-08-01 MED ORDER — ONDANSETRON HCL 4 MG/2ML IJ SOLN
INTRAMUSCULAR | Status: DC | PRN
  Administered 2019-08-01: 4 mg via INTRAVENOUS

## 2019-08-01 MED ORDER — IBUPROFEN 800 MG PO TABS
800.0000 mg | ORAL_TABLET | Freq: Three times a day (TID) | ORAL | Status: AC
  Administered 2019-08-01 – 2019-08-03 (×8): 800 mg via ORAL
  Filled 2019-08-01 (×8): qty 1

## 2019-08-01 MED ORDER — KETOROLAC TROMETHAMINE 30 MG/ML IJ SOLN
30.0000 mg | Freq: Once | INTRAMUSCULAR | Status: AC | PRN
  Administered 2019-08-01: 30 mg via INTRAVENOUS

## 2019-08-01 MED ORDER — PHENYLEPHRINE HCL-NACL 20-0.9 MG/250ML-% IV SOLN
INTRAVENOUS | Status: AC
  Filled 2019-08-01: qty 250

## 2019-08-01 MED ORDER — MENTHOL 3 MG MT LOZG: 1.0000 | LOZENGE | OROMUCOSAL | Status: DC | PRN

## 2019-08-01 MED ORDER — DEXAMETHASONE SODIUM PHOSPHATE 4 MG/ML IJ SOLN
INTRAMUSCULAR | Status: DC | PRN
  Administered 2019-08-01: 8 mg via INTRAVENOUS

## 2019-08-01 MED ORDER — METHYLERGONOVINE MALEATE 0.2 MG/ML IJ SOLN: 0.2000 mg | INTRAMUSCULAR | Status: DC | PRN

## 2019-08-01 MED ORDER — ZOLPIDEM TARTRATE 5 MG PO TABS: 5.0000 mg | ORAL_TABLET | Freq: Every evening | ORAL | Status: DC | PRN

## 2019-08-01 MED ORDER — SODIUM CHLORIDE 0.9 % IR SOLN
Status: DC | PRN
  Administered 2019-08-01: 1000 mL

## 2019-08-01 MED ORDER — DEXAMETHASONE SODIUM PHOSPHATE 4 MG/ML IJ SOLN
INTRAMUSCULAR | Status: AC
  Filled 2019-08-01: qty 7

## 2019-08-01 MED ORDER — SIMETHICONE 80 MG PO CHEW: 80.0000 mg | CHEWABLE_TABLET | ORAL | Status: DC | PRN

## 2019-08-01 MED ORDER — PRENATAL MULTIVITAMIN CH
1.0000 | ORAL_TABLET | Freq: Every day | ORAL | Status: DC
  Administered 2019-08-01 – 2019-08-03 (×3): 1 via ORAL
  Filled 2019-08-01 (×3): qty 1

## 2019-08-01 MED ORDER — SENNOSIDES-DOCUSATE SODIUM 8.6-50 MG PO TABS
2.0000 | ORAL_TABLET | ORAL | Status: DC
  Administered 2019-08-01 – 2019-08-03 (×3): 2 via ORAL
  Filled 2019-08-01 (×3): qty 2

## 2019-08-01 MED ORDER — NALOXONE HCL 4 MG/10ML IJ SOLN
1.0000 ug/kg/h | INTRAVENOUS | Status: DC | PRN
  Filled 2019-08-01: qty 5

## 2019-08-01 MED ORDER — KETOROLAC TROMETHAMINE 30 MG/ML IJ SOLN: 30.0000 mg | Freq: Four times a day (QID) | INTRAMUSCULAR | Status: AC | PRN

## 2019-08-01 MED ORDER — LACTATED RINGERS IV SOLN
INTRAVENOUS | Status: DC
  Administered 2019-08-01 (×3): via INTRAVENOUS

## 2019-08-01 MED ORDER — FENTANYL CITRATE (PF) 100 MCG/2ML IJ SOLN
INTRAMUSCULAR | Status: AC
  Filled 2019-08-01: qty 2

## 2019-08-01 MED ORDER — LACTATED RINGERS IV SOLN
INTRAVENOUS | Status: DC
  Administered 2019-08-01: 13:00:00 via INTRAVENOUS

## 2019-08-01 MED ORDER — SODIUM CHLORIDE 0.9 % IV SOLN
INTRAVENOUS | Status: DC | PRN
  Administered 2019-08-01: 08:00:00 via INTRAVENOUS

## 2019-08-01 MED ORDER — DIBUCAINE (PERIANAL) 1 % EX OINT: 1.0000 "application " | TOPICAL_OINTMENT | CUTANEOUS | Status: DC | PRN

## 2019-08-01 MED ORDER — MEPERIDINE HCL 25 MG/ML IJ SOLN: 6.2500 mg | INTRAMUSCULAR | Status: DC | PRN

## 2019-08-01 MED ORDER — SCOPOLAMINE 1 MG/3DAYS TD PT72
1.0000 | MEDICATED_PATCH | Freq: Once | TRANSDERMAL | Status: AC
  Administered 2019-08-01 (×2): 1.5 mg via TRANSDERMAL
  Filled 2019-08-01: qty 1

## 2019-08-01 MED ORDER — OXYCODONE HCL 5 MG PO TABS: 5.0000 mg | ORAL_TABLET | ORAL | Status: DC | PRN

## 2019-08-01 MED ORDER — SODIUM CHLORIDE 0.9% FLUSH: 3.0000 mL | INTRAVENOUS | Status: DC | PRN

## 2019-08-01 MED ORDER — WITCH HAZEL-GLYCERIN EX PADS: 1.0000 "application " | MEDICATED_PAD | CUTANEOUS | Status: DC | PRN

## 2019-08-01 MED ORDER — ACETAMINOPHEN 10 MG/ML IV SOLN
1000.0000 mg | Freq: Once | INTRAVENOUS | Status: DC | PRN
  Administered 2019-08-01: 1000 mg via INTRAVENOUS

## 2019-08-01 MED ORDER — ACETAMINOPHEN 500 MG PO TABS
1000.0000 mg | ORAL_TABLET | Freq: Four times a day (QID) | ORAL | Status: AC
  Administered 2019-08-01 – 2019-08-02 (×3): 1000 mg via ORAL
  Filled 2019-08-01 (×2): qty 2

## 2019-08-01 MED ORDER — SOD CITRATE-CITRIC ACID 500-334 MG/5ML PO SOLN
ORAL | Status: AC
  Filled 2019-08-01: qty 30

## 2019-08-01 MED ORDER — DIPHENHYDRAMINE HCL 25 MG PO CAPS: 25.0000 mg | ORAL_CAPSULE | ORAL | Status: DC | PRN

## 2019-08-01 MED ORDER — COCONUT OIL OIL
1.0000 "application " | TOPICAL_OIL | Status: DC | PRN
  Administered 2019-08-02: 1 via TOPICAL

## 2019-08-01 MED ORDER — DEXTROSE 5 % IV SOLN
INTRAVENOUS | Status: AC
  Filled 2019-08-01: qty 3000

## 2019-08-01 MED ORDER — ACETAMINOPHEN 325 MG PO TABS
650.0000 mg | ORAL_TABLET | ORAL | Status: DC | PRN
  Administered 2019-08-02 – 2019-08-03 (×4): 650 mg via ORAL
  Filled 2019-08-01 (×4): qty 2

## 2019-08-01 SURGICAL SUPPLY — 39 items
BARRIER ADHS 3X4 INTERCEED (GAUZE/BANDAGES/DRESSINGS) IMPLANT
BENZOIN TINCTURE PRP APPL 2/3 (GAUZE/BANDAGES/DRESSINGS) ×2 IMPLANT
CHLORAPREP W/TINT 26ML (MISCELLANEOUS) ×2 IMPLANT
CLAMP CORD UMBIL (MISCELLANEOUS) IMPLANT
CLOSURE STERI STRIP 1/2 X4 (GAUZE/BANDAGES/DRESSINGS) ×2 IMPLANT
CLOTH BEACON ORANGE TIMEOUT ST (SAFETY) ×2 IMPLANT
DRSG OPSITE POSTOP 4X10 (GAUZE/BANDAGES/DRESSINGS) ×2 IMPLANT
ELECT REM PT RETURN 9FT ADLT (ELECTROSURGICAL) ×2
ELECTRODE REM PT RTRN 9FT ADLT (ELECTROSURGICAL) ×1 IMPLANT
EXTRACTOR VACUUM KIWI (MISCELLANEOUS) IMPLANT
GLOVE BIOGEL M 7.0 STRL (GLOVE) ×4 IMPLANT
GLOVE BIOGEL PI IND STRL 7.0 (GLOVE) ×3 IMPLANT
GLOVE BIOGEL PI INDICATOR 7.0 (GLOVE) ×3
GOWN STRL REUS W/TWL LRG LVL3 (GOWN DISPOSABLE) ×6 IMPLANT
HOVERMATT SINGLE USE (MISCELLANEOUS) ×2 IMPLANT
KIT ABG SYR 3ML LUER SLIP (SYRINGE) IMPLANT
NEEDLE HYPO 25X5/8 SAFETYGLIDE (NEEDLE) IMPLANT
NS IRRIG 1000ML POUR BTL (IV SOLUTION) ×2 IMPLANT
PACK C SECTION WH (CUSTOM PROCEDURE TRAY) ×2 IMPLANT
PAD OB MATERNITY 4.3X12.25 (PERSONAL CARE ITEMS) ×2 IMPLANT
PENCIL SMOKE EVAC W/HOLSTER (ELECTROSURGICAL) ×2 IMPLANT
RETRACTOR TRAXI PANNICULUS (MISCELLANEOUS) ×1 IMPLANT
RTRCTR C-SECT PINK 25CM LRG (MISCELLANEOUS) IMPLANT
SPONGE LAP 18X18 RF (DISPOSABLE) ×2 IMPLANT
STRIP CLOSURE SKIN 1/2X4 (GAUZE/BANDAGES/DRESSINGS) ×2 IMPLANT
SUT PDS AB 0 CT1 27 (SUTURE) ×4 IMPLANT
SUT PLAIN 0 NONE (SUTURE) IMPLANT
SUT PLAIN 2 0 XLH (SUTURE) ×2 IMPLANT
SUT VIC AB 0 CTX 36 (SUTURE) ×3
SUT VIC AB 0 CTX36XBRD ANBCTRL (SUTURE) ×3 IMPLANT
SUT VIC AB 2-0 CT1 27 (SUTURE) ×1
SUT VIC AB 2-0 CT1 TAPERPNT 27 (SUTURE) ×1 IMPLANT
SUT VIC AB 3-0 SH 27 (SUTURE)
SUT VIC AB 3-0 SH 27X BRD (SUTURE) IMPLANT
SUT VIC AB 4-0 KS 27 (SUTURE) ×2 IMPLANT
TOWEL OR 17X24 6PK STRL BLUE (TOWEL DISPOSABLE) ×2 IMPLANT
TRAXI PANNICULUS RETRACTOR (MISCELLANEOUS) ×1
TRAY FOLEY W/BAG SLVR 14FR LF (SET/KITS/TRAYS/PACK) ×2 IMPLANT
WATER STERILE IRR 1000ML POUR (IV SOLUTION) ×2 IMPLANT

## 2019-08-01 NOTE — H&P (Signed)
Date of Initial H&P: 08/01/2019  History reviewed, patient examined, no change in status, stable for surgery.

## 2019-08-01 NOTE — Lactation Note (Signed)
This note was copied from a baby's chart. Lactation Consultation Note  Patient Name: Michele Pacheco Cardell [id number] Date: 08/01/2019 Reason for consult: Initial assessment;Term  P2 mother whose infant is now 42 hours old.  Mother breast fed her first child (now 33 years old) for 9 months.  Baby was asleep in mother's arms when I arrived.  Mother happily reported that he latched and fed well once since delivery.  Encouraged to feed 8-12 times/24 hours or sooner if baby shows feeding cues.  Mother familiar with feeding cues.  Offer to teach hand expression and mother agreeable.  Mother's breasts are soft and non tender and nipples are everted and intact.  She was able to return demonstrate but unable to obtain drops at this time.  Colostrum container provided for any EBM she obtains with hand expression.  Milk storage times reviewed and finger feeding demonstrated.  Mother has a DEBP for home use and will be returning to work after 12 weeks.  She will call for latch assistance as needed.  Mom made aware of O/P services, breastfeeding support groups, community resources, and our phone # for post-discharge questions. Father present.   Maternal Data Formula Feeding for Exclusion: No Has patient been taught Hand Expression?: Yes Does the patient have breastfeeding experience prior to this delivery?: Yes  Feeding Feeding Type: Breast Fed  LATCH Score Latch: Grasps breast easily, tongue down, lips flanged, rhythmical sucking.  Audible Swallowing: None  Type of Nipple: Everted at rest and after stimulation  Comfort (Breast/Nipple): Soft / non-tender  Hold (Positioning): No assistance needed to correctly position infant at breast.  LATCH Score: 8  Interventions    Lactation Tools Discussed/Used WIC Program: No   Consult Status Consult Status: Follow-up Date: 08/02/19 Follow-up type: In-patient    Little Ishikawa 08/01/2019, 12:54 PM

## 2019-08-01 NOTE — Anesthesia Postprocedure Evaluation (Signed)
Anesthesia Post Note  Patient: Michele Pacheco  Procedure(s) Performed: CESAREAN SECTION (N/A Abdomen)     Patient location during evaluation: PACU Anesthesia Type: Spinal Level of consciousness: oriented and awake and alert Pain management: pain level controlled Vital Signs Assessment: post-procedure vital signs reviewed and stable Respiratory status: spontaneous breathing, respiratory function stable and patient connected to nasal cannula oxygen Cardiovascular status: blood pressure returned to baseline and stable Postop Assessment: no headache, no backache and no apparent nausea or vomiting Anesthetic complications: no    Last Vitals:  Vitals:   08/01/19 1000 08/01/19 1011  BP: 117/65 (!) 115/59  Pulse: 72 64  Resp: 20 18  Temp:  36.5 C  SpO2: 98% 97%    Last Pain:  Vitals:   08/01/19 1011  TempSrc: Oral  PainSc:    Pain Goal: Patients Stated Pain Goal: 0 (08/01/19 0604)                 Michele Pacheco

## 2019-08-01 NOTE — Anesthesia Preprocedure Evaluation (Signed)
Anesthesia Evaluation  Patient identified by MRN, date of birth, ID band Patient awake    Reviewed: Allergy & Precautions, NPO status , Patient's Chart, lab work & pertinent test results  Airway Mallampati: III  TM Distance: >3 FB Neck ROM: Full    Dental no notable dental hx.    Pulmonary neg pulmonary ROS,    Pulmonary exam normal breath sounds clear to auscultation       Cardiovascular negative cardio ROS Normal cardiovascular exam Rhythm:Regular Rate:Normal     Neuro/Psych negative neurological ROS  negative psych ROS   GI/Hepatic negative GI ROS, Neg liver ROS,   Endo/Other  Morbid obesity  Renal/GU negative Renal ROS  negative genitourinary   Musculoskeletal negative musculoskeletal ROS (+)   Abdominal   Peds  Hematology negative hematology ROS (+)   Anesthesia Other Findings Repeat C/S x1, cerivcal fibroid  Reproductive/Obstetrics (+) Pregnancy                             Anesthesia Physical Anesthesia Plan  ASA: III  Anesthesia Plan: Spinal   Post-op Pain Management:    Induction:   PONV Risk Score and Plan: Treatment may vary due to age or medical condition  Airway Management Planned: Natural Airway  Additional Equipment:   Intra-op Plan:   Post-operative Plan:   Informed Consent: I have reviewed the patients History and Physical, chart, labs and discussed the procedure including the risks, benefits and alternatives for the proposed anesthesia with the patient or authorized representative who has indicated his/her understanding and acceptance.     Dental advisory given  Plan Discussed with: CRNA  Anesthesia Plan Comments:         Anesthesia Quick Evaluation

## 2019-08-01 NOTE — Transfer of Care (Signed)
Immediate Anesthesia Transfer of Care Note  Patient: Michele Pacheco  Procedure(s) Performed: CESAREAN SECTION (N/A Abdomen)  Patient Location: PACU  Anesthesia Type:Spinal  Level of Consciousness: awake, alert  and oriented  Airway & Oxygen Therapy: Patient Spontanous Breathing  Post-op Assessment: Report given to RN and Post -op Vital signs reviewed and stable  Post vital signs: Reviewed and stable  Last Vitals:  Vitals Value Taken Time  BP 133/71 08/01/19 0859  Temp 36.6 C 08/01/19 0859  Pulse 69 08/01/19 0859  Resp 18 08/01/19 0859  SpO2 98 % 08/01/19 0859  Patient states that her pain is a 3/10. She is able to move both of her legs.  Last Pain:  Vitals:   08/01/19 0859  TempSrc:   PainSc: 0-No pain      Patients Stated Pain Goal: 0 ([id number])  Complications: No apparent anesthesia complications

## 2019-08-01 NOTE — Op Note (Signed)
Cesarean Section Procedure Note  Indications: h/o cesarean section pt desires repeat / Cervical fibroid   Pre-operative Diagnosis: 39 week 3 day pregnancy.  Post-operative Diagnosis: same  Surgeon: Christophe Louis M.D.  Assistants: RNFA Heather  Anesthesia: Spinal anesthesia  ASA Class: 2   Procedure Details   The patient was seen in the Holding Room. The risks, benefits, complications, treatment options, and expected outcomes were discussed with the patient.  The patient concurred with the proposed plan, giving informed consent.  The site of surgery properly noted/marked. The patient was taken to Operating Room # B, identified as Michele Pacheco and the procedure verified as C-Section Delivery. A Time Out was held and the above information confirmed.  After induction of anesthesia, the patient was draped and prepped in the usual sterile manner. A Pfannenstiel incision was made and carried down through the subcutaneous tissue to the fascia. Fascial incision was made and extended transversely. The fascia was separated from the underlying rectus tissue superiorly and inferiorly. The peritoneum was identified and entered. Peritoneal incision was extended longitudinally. The utero-vesical peritoneal reflection was incised transversely and the bladder flap was bluntly freed from the lower uterine segment. A low transverse uterine incision was made. Delivered from cephalic presentation with assistance of kiwi vacuum)   Female with Apgar scores of 4 at one minute and 7 at five minutes. After the umbilical cord was clamped and cut cord blood was obtained for evaluation. The placenta was removed intact and appeared normal. The uterine outline, tubes and ovaries appeared normal. The uterine incision was closed with running locked sutures of 0 vicryl. . Hemostasis was observed. Lavage was carried out until clear. The fascia was then reapproximated with running sutures of 0 pds. . The skin was reapproximated  with 4-0 vicryl.  Instrument, sponge, and needle counts were correct prior the abdominal closure and at the conclusion of the case.   Findings: Female infant in cephalic presentation . Nuchal cord x 1 reduced. Normal fallopian tubes and ovaries   Estimated Blood Loss:  600 mL          Drains: None         Total IV Fluids:  Per anesthesia ml         Specimens: Placenta and to labor and delivery            Implants: None         Complications:  None; patient tolerated the procedure well.         Disposition: PACU - hemodynamically stable.         Condition: stable  Attending Attestation: I performed the procedure.

## 2019-08-01 NOTE — Anesthesia Procedure Notes (Addendum)
Spinal  Patient location during procedure: OR Start time: 08/01/2019 7:30 AM End time: 08/01/2019 7:40 AM Staffing Anesthesiologist: Freddrick March, MD Performed: anesthesiologist  Preanesthetic Checklist Completed: patient identified, surgical consent, pre-op evaluation, timeout performed, IV checked, risks and benefits discussed and monitors and equipment checked Spinal Block Patient position: sitting Prep: site prepped and draped and DuraPrep Patient monitoring: cardiac monitor, continuous pulse ox and blood pressure Approach: midline Location: L3-4 Injection technique: single-shot Needle Needle type: Tuohy and Pencan  Needle gauge: 25 G Needle length: 9 cm Assessment Sensory level: T6 Additional Notes Functioning IV was confirmed and monitors were applied. Sterile prep and drape, including hand hygiene and sterile gloves were used. The patient was positioned and the spine was prepped. The skin was anesthetized with lidocaine.  Free flow of clear CSF was obtained prior to injecting local anesthetic into the CSF.  The spinal needle aspirated freely following injection.  The needle was carefully withdrawn.  The patient tolerated the procedure well.

## 2019-08-02 LAB — CBC
HCT: 32.6 % — ABNORMAL LOW (ref 36.0–46.0)
Hemoglobin: 11.2 g/dL — ABNORMAL LOW (ref 12.0–15.0)
MCH: 29.5 pg (ref 26.0–34.0)
MCHC: 34.4 g/dL (ref 30.0–36.0)
MCV: 85.8 fL (ref 80.0–100.0)
Platelets: 233 10*3/uL (ref 150–400)
RBC: 3.8 MIL/uL — ABNORMAL LOW (ref 3.87–5.11)
RDW: 14.7 % (ref 11.5–15.5)
WBC: 15.5 10*3/uL — ABNORMAL HIGH (ref 4.0–10.5)
nRBC: 0 % (ref 0.0–0.2)

## 2019-08-02 LAB — BIRTH TISSUE RECOVERY COLLECTION (PLACENTA DONATION)

## 2019-08-02 MED ORDER — IBUPROFEN 800 MG PO TABS: 800.0000 mg | ORAL_TABLET | Freq: Three times a day (TID) | ORAL | 1 refills | Status: DC | PRN

## 2019-08-02 MED ORDER — OXYCODONE HCL 5 MG PO TABS: 5.0000 mg | ORAL_TABLET | ORAL | 0 refills | Status: AC | PRN

## 2019-08-02 MED ORDER — ACETAMINOPHEN 500 MG PO TABS: 1000.0000 mg | ORAL_TABLET | Freq: Three times a day (TID) | ORAL | 0 refills | Status: DC | PRN

## 2019-08-02 NOTE — Progress Notes (Signed)
Subjective: Postpartum Day 1: Cesarean Delivery Patient reports incisional pain, tolerating PO, + flatus and no problems voiding.  Pt is ambulating well in room   Objective: Vital signs in last 24 hours: Temp:  [97.8 F (36.6 C)-98.1 F (36.7 C)] 98.1 F (36.7 C) (11/17 1655) Pulse Rate:  [73-83] 73 (11/17 1655) Resp:  [18-20] 20 (11/17 1655) BP: (112-126)/(65-69) 126/69 (11/17 1655) SpO2:  [99 %-100 %] 100 % (11/17 1655)  Physical Exam:  General: alert, cooperative and no distress Lochia: appropriate Uterine Fundus: firm Incision: no significant drainage DVT Evaluation: No evidence of DVT seen on physical exam.  Recent Labs    08/02/19 0446  HGB 11.2*  HCT 32.6*    Assessment/Plan: Status post Cesarean section. Doing well postoperatively.  Continue current care Obesity- lovenox cancelled as pt is ambulating well  Plan for circumcision prior to discharge  Pt desires early discharge tomorrow .  Christophe Louis 08/02/2019, 4:59 PM

## 2019-08-03 NOTE — Discharge Summary (Signed)
OB Discharge Summary     Patient Name: Michele Pacheco DOB: [birth year] MRN: [id number]  Date of admission: 08/01/2019 Delivering MD: Christophe Louis   Date of discharge: 08/03/2019  Admitting diagnosis: O34.219 H-O cesarean section complicating pregnancy Intrauterine pregnancy: [redacted]w[redacted]d     Secondary diagnosis:  Active Problems:   Delivery of pregnancy by cesarean section  Additional problems:Cervical Fibroid      Discharge diagnosis: Term Pregnancy Delivered                                                                                                Post partum procedures:None  Augmentation: NA  Complications: None  Hospital course:  Sceduled C/S   33 y.o. yo G2P2002 at [redacted]w[redacted]d was admitted to the hospital 08/01/2019 for scheduled cesarean section with the following indication:Elective Repeat.  Membrane Rupture Time/Date: 8:01 AM ,08/01/2019   Patient delivered a Viable infant.08/01/2019  Details of operation can be found in separate operative note.  Pateint had an uncomplicated postpartum course.  She is ambulating, tolerating a regular diet, passing flatus, and urinating well. Patient is discharged home in stable condition on  08/03/19         Physical exam  Vitals:   08/02/19 1655 08/02/19 2212 08/03/19 0540 08/03/19 1432  BP: 126/69 124/81 121/77 133/88  Pulse: 73 60 79 79  Resp: 20  18 18   Temp: 98.1 F (36.7 C) 97.9 F (36.6 C) 98 F (36.7 C) (!) 97.2 F (36.2 C)  TempSrc: Oral Oral Oral Oral  SpO2: 100% 98% 99%   Weight:      Height:       General: alert, cooperative and no distress Lochia: appropriate Uterine Fundus: firm Incision: Dressing is clean, dry, and intact DVT Evaluation: No evidence of DVT seen on physical exam. Labs: Lab Results  Component Value Date   WBC 15.5 (H) 08/02/2019   HGB 11.2 (L) 08/02/2019   HCT 32.6 (L) 08/02/2019   MCV 85.8 08/02/2019   PLT 233 08/02/2019   No flowsheet data found.  Discharge instruction: per After Visit  Summary and "Baby and Me Booklet".  After visit meds:  Allergies as of 08/03/2019      Reactions   Vitamin C Rash      Medication List    TAKE these medications   acetaminophen 500 MG tablet Commonly known as: TYLENOL Take 2 tablets (1,000 mg total) by mouth every 8 (eight) hours as needed for mild pain or moderate pain.   ibuprofen 800 MG tablet Commonly known as: ADVIL Take 1 tablet (800 mg total) by mouth every 8 (eight) hours as needed.   oxyCODONE 5 MG immediate release tablet Commonly known as: Oxy IR/ROXICODONE Take 1-2 tablets (5-10 mg total) by mouth every 4 (four) hours as needed for up to 5 days for moderate pain.   prenatal multivitamin Tabs tablet Take 1 tablet by mouth at bedtime.       Diet: routine diet  Activity: Advance as tolerated. Pelvic rest for 6 weeks.   Outpatient follow up:2 weeks Follow up Appt:No future appointments. Follow up Visit:No follow-ups on  file.  Postpartum contraception: Not Discussed  Newborn Data: Live born female  Birth Weight: 8 lb 8.9 oz (3880 g) APGAR: 4, 7  Newborn Delivery   Birth date/time: 08/01/2019 08:02:00 Delivery type: C-Section, Vacuum Assisted Trial of labor: No C-section categorization: Repeat      Baby Feeding: Breast Disposition:home with mother   08/03/2019 Christophe Louis, MD

## 2019-08-03 NOTE — Lactation Note (Signed)
This note was copied from a baby's chart. Lactation Consultation Note  Patient Name: Michele Pacheco [id number] Date: 08/03/2019 Reason for consult: Follow-up assessment;Nipple pain/trauma;Term Baby is 52 hours old/8% weight loss.  Mom c/o of sore nipples and painful latches.  Nipples with positional stripes.  Mom was attempting to latch baby using cradle hold.  No pillow support and baby not in close enough.  Recommended the football hold and mom agreeable.  Assisted with positioning baby in football hold on right.  Baby opened wide.  Initial latch was shallow so baby taken off.  Baby then latched with depth.  Mom can tell the difference and more comfortable.  Instructed on breast massage during feeding.  Discussed milk coming to volume and the prevention and treatment of engorgement.  Questions answered.  Recommended using comfort gels.  Encouraged to call for assist prn.  Maternal Data    Feeding Feeding Type: Breast Fed  LATCH Score Latch: Grasps breast easily, tongue down, lips flanged, rhythmical sucking.  Audible Swallowing: A few with stimulation  Type of Nipple: Everted at rest and after stimulation  Comfort (Breast/Nipple): Filling, red/small blisters or bruises, mild/mod discomfort  Hold (Positioning): Assistance needed to correctly position infant at breast and maintain latch.  LATCH Score: 7  Interventions Interventions: Adjust position;Assisted with latch;Support pillows;Position options;Breast massage  Lactation Tools Discussed/Used Tools: Coconut oil;Comfort gels   Consult Status Consult Status: Follow-up Date: 08/04/19 Follow-up type: In-patient    Ave Filter 08/03/2019, 12:35 PM

## 2019-08-04 NOTE — Progress Notes (Signed)
Subjective: Postpartum Day 3: Cesarean Delivery Patient reports tolerating PO, + flatus and no problems voiding.    Objective: Vital signs in last 24 hours: Temp:  [97.2 F (36.2 C)-98.1 F (36.7 C)] 98.1 F (36.7 C) (11/19 0500) Pulse Rate:  [60-79] 60 (11/19 0500) Resp:  [18] 18 (11/19 0500) BP: (133-137)/(73-88) 137/73 (11/19 0500) SpO2:  [100 %] 100 % (11/19 0500)  Physical Exam:  General: alert, cooperative and no distress Lochia: appropriate Uterine Fundus: firm Incision: healing well DVT Evaluation: No evidence of DVT seen on physical exam.  Recent Labs    08/02/19 0446  HGB 11.2*  HCT 32.6*    Assessment/Plan: Status post Cesarean section. Doing well postoperatively.  Discharge home with standard precautions and return to clinic in 2 weeks.  Christophe Louis 08/04/2019, 6:47 AM

## 2019-08-04 NOTE — Lactation Note (Signed)
This note was copied from a baby's chart. Lactation Consultation Note:  Infant is 78 hours old. He voided at 8 am.  Mother reports that her nipples are cracked and very sore. Mother does not wish to latch infant on until her nipples heal. Mother bottle fed ebm with last several feeding. She reports that infant took 25 ml.  Lots of discussion on proper latch technique and using good support.  Advised mother to get [facility] Rx from MD.  Mother has comfort gels and coconut oil. She has a Spectra pump and a Haka at home.  Mother advised to continue to pump every 2-3 hours for 15-20 mins.  Encouraged to do frequent STS .  Advised to do good hand expression, breast massage .  Discussed firming nipple and doing reverse pressure , pre pumping before latching infant.  Discussed treatment and prevention of engorgement.   Mother reports that she would like to have an [facility] consult to continue to aid in latching infant.   Mother is aware of milk storage guidelines . She is also aware of available LC services at [facility]. LC will add not for mother to get a follow up phone call from [facility] office for OP visit.      Patient Name: Abegayle Curenton [id number] Date: 08/04/2019 Reason for consult: Follow-up assessment   Maternal Data    Feeding Feeding Type: Breast Milk  LATCH Score                   Interventions    Lactation Tools Discussed/Used     Consult Status      Darla Lesches 08/04/2019, 9:29 AM

## 2019-08-09 ENCOUNTER — Ambulatory Visit: Payer: Self-pay

## 2019-08-09 NOTE — Lactation Note (Signed)
This note was copied from a baby's chart. Lactation Consultation Note  Patient Name: Michele Pacheco Today's Date: 08/09/2019     08/09/2019  Name: Michele Pacheco MRN: [id number] Date of Birth: 08/01/2019 Gestational Age: Gestational Age: [redacted]w[redacted]d Birth Weight: 136.9 oz Weight today:  Weight: 8 lb 3.3 oz (3722 g)  8 day old term infant presents today with mom for feeding assessment.   Mom has been pumping and bottle feeding. Infant did not latch after going home. Mom with sore cracked nipples that have healed.   Infant has gained 177 grams in the last 5 days with an average daily weight gain of 35 grams a day.   Infant with thick labial frenulum that inserts mid way down the gum ridge. Upper lip needs flanging on the breast and the bottle. Upper lip tight with flanging. Infant with short thick anterior/posterior lingual frenulum. Infant with snapback with tongue with initial suckling and decreased mid tongue elevation. Infant sleepy at the breast. Mom with a lot of nipple trauma that is now healing. Infant was not latching recently. Infant is drooling some on the bottle. She is using the Munchkin Latch bottle and Tommie Tippee. Infant drooled more with the Tommie Tippee. Reviewed how tongue and lip restrictions can effect weight gain and milk transfer. Discussed having infant evaluated by an Oral Specialist.   Latched infant to the left breast. Infant latched for a few minutes. Mom with pinching with feeding, nipple asymmetrical post feeding. Infant slipped off pretty soon. # 24 NS placed, infant latched and fed for < 5 minutes and pulled off frustrated. Infant feeding finished with bottle. Mom needed to flange upper lip throughout feeding. Infant sleepy at the breast pretty soon.   Discussed slower flow nipples since drooling on the bottle. Suck training taught and enc mom to do prior to each feeding.   Reviewed nipple pain and nipple care. Reviewed APNO. Reviewed NS use and how  to apply and clean. Mom did report increased comfort with NS use.   Infant to follow up with Dr. Abner Greenspan tomorrow. Infant to follow up with Lactation in 2 weeks or 1-5 days post tongue and lip releases if completed.      General Information: Mother's reason for visit: Feeding assessment, weight loss Consult: Initial Lactation consultant: Ivin Booty Hice RN,IBCLC Breastfeeding experience: pumping and bottle feeding, excoriated nipples   Maternal medications: Pre-natal vitamin  Breastfeeding History: Frequency of breast feeding: not latching currently    Supplementation: Supplement method: bottle(Munchkin Latch mainly) Brand: Enfamil Formula volume: 2-2.5 ounces Formula frequency: 2 x a day   Breast milk volume: 2-2.5 ounces Breast milk frequency: 6-8 x a day   Pump type: Spectra Pump frequency: every 2 hours Pump volume: 3 ounces  Infant Output Assessment: Voids per 24 hours: 8+ Urine color: Dark yellow Stools per 24 hours: 8+ Stool color: Yellow  Breast Assessment: Breast: Filling, Compressible Nipple: Erect, Cracked, Scabs(large diameter, Nipples are healing) Pain level: 5 Pain interventions: Bra, Coconut oil, Breast pump, Nipple shield  Feeding Assessment: Infant oral assessment: Variance Infant oral assessment comment: see note Positioning: Football(right breast, < 5 minutes) Latch: 1 - Repeated attempts needed to sustain latch, nipple held in mouth throughout feeding, stimulation needed to elicit sucking reflex. Audible swallowing: 1 - A few with stimulation Type of nipple: 2 - Everted at rest and after stimulation Comfort: 1 - Filling, red/small blisters or bruises, mild/mod discomfort Hold: 1 - Assistance needed to correctly position infant at breast and maintain latch  LATCH score: 6 Latch assessment: Deep Lips flanged: No(upper lip needs flanging) Suck assessment: Displays both Tools: Nipple shield 24 mm Pre-feed weight: 3722 grams Post feed weight: 3722  grams Amount transferred: 0 Amount supplemented: 60 ml via bottle  Additional Feeding Assessment:                                    Totals: Total amount transferred: 0 Total supplement given: 60 ml Total amount pumped post feed: did not pump   Plan:  1. Offer infant the breast with feeding cues, try to latch 3-4 x a day or more 2. Flange lips after latching 3. Suck training prior to latch 4. Stimulate to keep infant awake with feeding as needed 5. Massage/compress the breast to keep infant active at the breast 6. Use the # 24 Nipple Shield with feeding as needed to decrease pain to mom 7. Offer both breasts with each feeding 8. Empty the first breast before offering the second breast 9. Prepump to get the milk flowing before latch 10. Offer infant a bottle of pumped milk (1/2-1 ounce) if he is frustrated at the breast 11. Offer infant a bottle of pumped milk is still cueing to feed after breast feeding 12. Offer infant the bottle using the paced bottle feeding method (video on kellymom.com) 13. Infant needs about 69-93 ml (2.5-3 ounces) for 8 feedings a day or 555-740 ml (19-25ounces) in 24 hours. Infant may take more or less with each feeding depending on how often he feeds. Feed infant until he is satisfied.  14.  Would recommend that you pump 8 times a day with your double electric breast pump for 15-20 minutes to promote and protect your milk supply.  15. All Purpose Nipple Ointment may be helpful to heal your nipples. Can call OB for prescription.  16. Keep up the good work 78. Thank you for allowing me to assist you today 18. Please call with any questions/concerns as needed [phone] 19. Follow up with Lactation in 2 weeks or 1-5 days post tongue and lip releases if completed.    Debby Freiberg Hice RN, IBCLC                                                      Debby Freiberg Hice 08/09/2019, 1:24 PM

## 2019-10-13 ENCOUNTER — Ambulatory Visit: Payer: Managed Care, Other (non HMO) | Attending: Internal Medicine

## 2019-10-13 DIAGNOSIS — Z20822 Contact with and (suspected) exposure to covid-19: Secondary | ICD-10-CM

## 2019-10-14 LAB — NOVEL CORONAVIRUS, NAA: SARS-CoV-2, NAA: NOT DETECTED

## 2019-10-24 ENCOUNTER — Ambulatory Visit: Payer: Managed Care, Other (non HMO) | Attending: Internal Medicine

## 2019-10-24 DIAGNOSIS — Z20822 Contact with and (suspected) exposure to covid-19: Secondary | ICD-10-CM

## 2019-10-25 LAB — NOVEL CORONAVIRUS, NAA: SARS-CoV-2, NAA: NOT DETECTED

## 2020-05-18 ENCOUNTER — Other Ambulatory Visit: Payer: Managed Care, Other (non HMO)

## 2020-05-18 ENCOUNTER — Other Ambulatory Visit: Payer: Self-pay | Admitting: Critical Care Medicine

## 2020-05-18 DIAGNOSIS — Z20822 Contact with and (suspected) exposure to covid-19: Secondary | ICD-10-CM

## 2020-05-20 LAB — NOVEL CORONAVIRUS, NAA: SARS-CoV-2, NAA: NOT DETECTED

## 2021-11-20 LAB — HEPATITIS C ANTIBODY: HCV Ab: NEGATIVE

## 2021-11-20 LAB — OB RESULTS CONSOLE ABO/RH: RH Type: POSITIVE

## 2021-11-20 LAB — OB RESULTS CONSOLE RPR: RPR: NONREACTIVE

## 2021-11-20 LAB — OB RESULTS CONSOLE HIV ANTIBODY (ROUTINE TESTING): HIV: NONREACTIVE

## 2021-11-20 LAB — OB RESULTS CONSOLE RUBELLA ANTIBODY, IGM: Rubella: IMMUNE

## 2021-11-20 LAB — OB RESULTS CONSOLE HEPATITIS B SURFACE ANTIGEN: Hepatitis B Surface Ag: NEGATIVE

## 2021-11-20 LAB — OB RESULTS CONSOLE ANTIBODY SCREEN: Antibody Screen: NEGATIVE

## 2021-11-25 ENCOUNTER — Other Ambulatory Visit: Payer: Self-pay | Admitting: Obstetrics and Gynecology

## 2021-11-25 ENCOUNTER — Other Ambulatory Visit (HOSPITAL_COMMUNITY)
Admission: RE | Admit: 2021-11-25 | Discharge: 2021-11-25 | Disposition: A | Discharge status: Home / Self Care | Payer: BC Managed Care – PPO | Source: Ambulatory Visit | Attending: Obstetrics and Gynecology | Admitting: Obstetrics and Gynecology

## 2021-11-25 DIAGNOSIS — Z3490 Encounter for supervision of normal pregnancy, unspecified, unspecified trimester: Secondary | ICD-10-CM | POA: Insufficient documentation

## 2021-11-25 DIAGNOSIS — Z124 Encounter for screening for malignant neoplasm of cervix: Secondary | ICD-10-CM | POA: Diagnosis present

## 2021-11-25 LAB — OB RESULTS CONSOLE GC/CHLAMYDIA
Chlamydia: NEGATIVE
Neisseria Gonorrhea: NEGATIVE

## 2021-11-29 LAB — CYTOLOGY - PAP
Comment: NEGATIVE
Diagnosis: NEGATIVE
High risk HPV: NEGATIVE

## 2022-04-22 LAB — OB RESULTS CONSOLE RPR: RPR: NONREACTIVE

## 2022-06-11 LAB — OB RESULTS CONSOLE GBS: GBS: NEGATIVE

## 2022-06-25 ENCOUNTER — Encounter (HOSPITAL_COMMUNITY): Payer: Self-pay | Admitting: *Deleted

## 2022-06-25 NOTE — Patient Instructions (Signed)
Michele Pacheco  06/25/2022   Your procedure is scheduled on:  07/07/2022  Arrive at 4 at [facility] on [address] at [facility]  and [address]. You are invited to use the FREE valet parking or use the Visitor's parking deck.  Pick up the phone at the desk and dial [phone].  Call this number if you have problems the morning of surgery: [phone]  Remember:   Do not eat food:(After Midnight) Desps de medianoche.  Do not drink clear liquids: (After Midnight) Desps de medianoche.  Take these medicines the morning of surgery with A SIP OF WATER:  none   Do not wear jewelry, make-up or nail polish.  Do not wear lotions, powders, or perfumes. Do not wear deodorant.  Do not shave 48 hours prior to surgery.  Do not bring valuables to the hospital.  [facility] is not   responsible for any belongings or valuables brought to the hospital.  Contacts, dentures or bridgework may not be worn into surgery.  Leave suitcase in the car. After surgery it may be brought to your room.  For patients admitted to the hospital, checkout time is 11:00 AM the day of              discharge.      Please read over the following fact sheets that you were given:     Preparing for Surgery

## 2022-06-26 ENCOUNTER — Encounter (HOSPITAL_COMMUNITY): Payer: Self-pay

## 2022-07-02 ENCOUNTER — Other Ambulatory Visit: Payer: Self-pay | Admitting: Obstetrics and Gynecology

## 2022-07-02 DIAGNOSIS — O34219 Maternal care for unspecified type scar from previous cesarean delivery: Secondary | ICD-10-CM

## 2022-07-02 MED ORDER — BUPIVACAINE LIPOSOME 1.3 % IJ SUSP: 20.0000 mL | Freq: Once | INTRAMUSCULAR | Status: AC

## 2022-07-04 ENCOUNTER — Encounter (HOSPITAL_COMMUNITY)
Admission: RE | Admit: 2022-07-04 | Discharge: 2022-07-04 | Disposition: A | Discharge status: Home / Self Care | Payer: BC Managed Care – PPO | Source: Ambulatory Visit | Attending: Family Medicine | Admitting: Family Medicine

## 2022-07-04 DIAGNOSIS — O34219 Maternal care for unspecified type scar from previous cesarean delivery: Secondary | ICD-10-CM | POA: Insufficient documentation

## 2022-07-04 DIAGNOSIS — Z3A39 39 weeks gestation of pregnancy: Secondary | ICD-10-CM | POA: Insufficient documentation

## 2022-07-04 LAB — CBC
HCT: 35.9 % — ABNORMAL LOW (ref 36.0–46.0)
Hemoglobin: 12 g/dL (ref 12.0–15.0)
MCH: 28.7 pg (ref 26.0–34.0)
MCHC: 33.4 g/dL (ref 30.0–36.0)
MCV: 85.9 fL (ref 80.0–100.0)
Platelets: 272 10*3/uL (ref 150–400)
RBC: 4.18 MIL/uL (ref 3.87–5.11)
RDW: 14.1 % (ref 11.5–15.5)
WBC: 9 10*3/uL (ref 4.0–10.5)
nRBC: 0 % (ref 0.0–0.2)

## 2022-07-04 LAB — RPR: RPR Ser Ql: NONREACTIVE

## 2022-07-04 LAB — TYPE AND SCREEN
ABO/RH(D): B POS
Antibody Screen: NEGATIVE

## 2022-07-06 ENCOUNTER — Other Ambulatory Visit: Payer: Self-pay | Admitting: Obstetrics and Gynecology

## 2022-07-06 NOTE — H&P (Signed)
  The note originally documented on this encounter has been moved the the encounter in which it belongs.  

## 2022-07-07 ENCOUNTER — Other Ambulatory Visit: Payer: Self-pay

## 2022-07-07 ENCOUNTER — Inpatient Hospital Stay (HOSPITAL_COMMUNITY): Payer: BC Managed Care – PPO | Admitting: Anesthesiology

## 2022-07-07 ENCOUNTER — Encounter (HOSPITAL_COMMUNITY)
Admission: RE | Disposition: A | Discharge status: Home / Self Care | Payer: Self-pay | Source: Home / Self Care | Attending: Obstetrics and Gynecology

## 2022-07-07 ENCOUNTER — Encounter (HOSPITAL_COMMUNITY): Payer: Self-pay | Admitting: Obstetrics and Gynecology

## 2022-07-07 ENCOUNTER — Inpatient Hospital Stay (HOSPITAL_COMMUNITY)
Admission: RE | Admit: 2022-07-07 | Discharge: 2022-07-09 | DRG: 788 | Disposition: A | Discharge status: Home / Self Care | Payer: BC Managed Care – PPO | Attending: Obstetrics and Gynecology | Admitting: Obstetrics and Gynecology

## 2022-07-07 DIAGNOSIS — Z3A39 39 weeks gestation of pregnancy: Secondary | ICD-10-CM | POA: Diagnosis not present

## 2022-07-07 DIAGNOSIS — O3413 Maternal care for benign tumor of corpus uteri, third trimester: Secondary | ICD-10-CM | POA: Diagnosis present

## 2022-07-07 DIAGNOSIS — O34219 Maternal care for unspecified type scar from previous cesarean delivery: Principal | ICD-10-CM | POA: Diagnosis present

## 2022-07-07 DIAGNOSIS — O99214 Obesity complicating childbirth: Secondary | ICD-10-CM | POA: Diagnosis present

## 2022-07-07 DIAGNOSIS — Z98891 History of uterine scar from previous surgery: Principal | ICD-10-CM

## 2022-07-07 DIAGNOSIS — D259 Leiomyoma of uterus, unspecified: Secondary | ICD-10-CM | POA: Diagnosis present

## 2022-07-07 SURGERY — Surgical Case
Anesthesia: Spinal

## 2022-07-07 MED ORDER — FENTANYL CITRATE (PF) 100 MCG/2ML IJ SOLN
INTRAMUSCULAR | Status: AC
  Filled 2022-07-07: qty 2

## 2022-07-07 MED ORDER — PROPOFOL 10 MG/ML IV BOLUS
INTRAVENOUS | Status: AC
  Filled 2022-07-07: qty 20

## 2022-07-07 MED ORDER — SODIUM CHLORIDE (PF) 0.9 % IJ SOLN
INTRAMUSCULAR | Status: AC
  Filled 2022-07-07: qty 50

## 2022-07-07 MED ORDER — TRANEXAMIC ACID-NACL 1000-0.7 MG/100ML-% IV SOLN: 1000.0000 mg | INTRAVENOUS | Status: DC

## 2022-07-07 MED ORDER — STERILE WATER FOR IRRIGATION IR SOLN
Status: DC | PRN
  Administered 2022-07-07: 1000 mL

## 2022-07-07 MED ORDER — WITCH HAZEL-GLYCERIN EX PADS: 1.0000 | MEDICATED_PAD | CUTANEOUS | Status: DC | PRN

## 2022-07-07 MED ORDER — IBUPROFEN 600 MG PO TABS
600.0000 mg | ORAL_TABLET | Freq: Four times a day (QID) | ORAL | Status: DC
  Administered 2022-07-07 – 2022-07-09 (×7): 600 mg via ORAL
  Filled 2022-07-07 (×7): qty 1

## 2022-07-07 MED ORDER — DEXAMETHASONE SODIUM PHOSPHATE 10 MG/ML IJ SOLN
INTRAMUSCULAR | Status: AC
  Filled 2022-07-07: qty 1

## 2022-07-07 MED ORDER — SIMETHICONE 80 MG PO CHEW
80.0000 mg | CHEWABLE_TABLET | Freq: Three times a day (TID) | ORAL | Status: DC
  Administered 2022-07-07 – 2022-07-09 (×6): 80 mg via ORAL
  Filled 2022-07-07 (×6): qty 1

## 2022-07-07 MED ORDER — MENTHOL 3 MG MT LOZG: 1.0000 | LOZENGE | OROMUCOSAL | Status: DC | PRN

## 2022-07-07 MED ORDER — MORPHINE SULFATE (PF) 0.5 MG/ML IJ SOLN
INTRAMUSCULAR | Status: AC
  Filled 2022-07-07: qty 10

## 2022-07-07 MED ORDER — BUPIVACAINE IN DEXTROSE 0.75-8.25 % IT SOLN
INTRATHECAL | Status: AC
  Filled 2022-07-07: qty 2

## 2022-07-07 MED ORDER — DIBUCAINE (PERIANAL) 1 % EX OINT: 1.0000 | TOPICAL_OINTMENT | CUTANEOUS | Status: DC | PRN

## 2022-07-07 MED ORDER — KETOROLAC TROMETHAMINE 30 MG/ML IJ SOLN
30.0000 mg | Freq: Four times a day (QID) | INTRAMUSCULAR | Status: AC | PRN
  Filled 2022-07-07: qty 1

## 2022-07-07 MED ORDER — HYDROMORPHONE HCL 1 MG/ML IJ SOLN: 0.2000 mg | INTRAMUSCULAR | Status: DC | PRN

## 2022-07-07 MED ORDER — TRANEXAMIC ACID-NACL 1000-0.7 MG/100ML-% IV SOLN
INTRAVENOUS | Status: DC | PRN
  Administered 2022-07-07: 1000 mg via INTRAVENOUS

## 2022-07-07 MED ORDER — PHENYLEPHRINE HCL-NACL 20-0.9 MG/250ML-% IV SOLN
INTRAVENOUS | Status: AC
  Filled 2022-07-07: qty 250

## 2022-07-07 MED ORDER — CEFAZOLIN IN SODIUM CHLORIDE 3-0.9 GM/100ML-% IV SOLN
3.0000 g | INTRAVENOUS | Status: AC
  Administered 2022-07-07: 3 g via INTRAVENOUS

## 2022-07-07 MED ORDER — KETOROLAC TROMETHAMINE 30 MG/ML IJ SOLN
30.0000 mg | Freq: Once | INTRAMUSCULAR | Status: AC
  Administered 2022-07-07: 30 mg via INTRAVENOUS

## 2022-07-07 MED ORDER — PHENYLEPHRINE HCL-NACL 20-0.9 MG/250ML-% IV SOLN
INTRAVENOUS | Status: DC | PRN
  Administered 2022-07-07: 60 ug/min via INTRAVENOUS

## 2022-07-07 MED ORDER — SODIUM CHLORIDE 0.9 % IR SOLN
Status: DC | PRN
  Administered 2022-07-07: 1

## 2022-07-07 MED ORDER — SOD CITRATE-CITRIC ACID 500-334 MG/5ML PO SOLN
30.0000 mL | ORAL | Status: AC
  Administered 2022-07-07: 30 mL via ORAL

## 2022-07-07 MED ORDER — POVIDONE-IODINE 10 % EX SWAB
2.0000 | Freq: Once | CUTANEOUS | Status: AC
  Administered 2022-07-07: 2 via TOPICAL

## 2022-07-07 MED ORDER — OXYTOCIN-SODIUM CHLORIDE 30-0.9 UT/500ML-% IV SOLN: 2.5000 [IU]/h | INTRAVENOUS | Status: AC

## 2022-07-07 MED ORDER — LIDOCAINE-EPINEPHRINE (PF) 2 %-1:200000 IJ SOLN
INTRAMUSCULAR | Status: AC
  Filled 2022-07-07: qty 20

## 2022-07-07 MED ORDER — ACETAMINOPHEN 500 MG PO TABS
ORAL_TABLET | ORAL | Status: AC
  Filled 2022-07-07: qty 2

## 2022-07-07 MED ORDER — DEXMEDETOMIDINE HCL IN NACL 80 MCG/20ML IV SOLN
INTRAVENOUS | Status: AC
  Filled 2022-07-07: qty 20

## 2022-07-07 MED ORDER — DIPHENHYDRAMINE HCL 50 MG/ML IJ SOLN
12.5000 mg | INTRAMUSCULAR | Status: DC | PRN
  Administered 2022-07-07 (×2): 12.5 mg via INTRAVENOUS
  Filled 2022-07-07 (×2): qty 1

## 2022-07-07 MED ORDER — ONDANSETRON HCL 4 MG/2ML IJ SOLN
INTRAMUSCULAR | Status: AC
  Filled 2022-07-07: qty 2

## 2022-07-07 MED ORDER — ACETAMINOPHEN 500 MG PO TABS: 1000.0000 mg | ORAL_TABLET | Freq: Four times a day (QID) | ORAL | Status: DC

## 2022-07-07 MED ORDER — KETOROLAC TROMETHAMINE 30 MG/ML IJ SOLN: 30.0000 mg | Freq: Four times a day (QID) | INTRAMUSCULAR | Status: AC | PRN

## 2022-07-07 MED ORDER — ZOLPIDEM TARTRATE 5 MG PO TABS: 5.0000 mg | ORAL_TABLET | Freq: Every evening | ORAL | Status: DC | PRN

## 2022-07-07 MED ORDER — FENTANYL CITRATE (PF) 250 MCG/5ML IJ SOLN
INTRAMUSCULAR | Status: AC
  Filled 2022-07-07: qty 5

## 2022-07-07 MED ORDER — KETOROLAC TROMETHAMINE 30 MG/ML IJ SOLN
INTRAMUSCULAR | Status: AC
  Filled 2022-07-07: qty 1

## 2022-07-07 MED ORDER — SODIUM CHLORIDE 0.9% FLUSH: 3.0000 mL | INTRAVENOUS | Status: DC | PRN

## 2022-07-07 MED ORDER — FENTANYL CITRATE (PF) 100 MCG/2ML IJ SOLN
INTRAMUSCULAR | Status: DC | PRN
  Administered 2022-07-07: 15 ug via INTRATHECAL

## 2022-07-07 MED ORDER — BUPIVACAINE IN DEXTROSE 0.75-8.25 % IT SOLN
INTRATHECAL | Status: DC | PRN
  Administered 2022-07-07: 1.7 mg via INTRATHECAL

## 2022-07-07 MED ORDER — NALOXONE HCL 0.4 MG/ML IJ SOLN: 0.4000 mg | INTRAMUSCULAR | Status: DC | PRN

## 2022-07-07 MED ORDER — SENNOSIDES-DOCUSATE SODIUM 8.6-50 MG PO TABS
2.0000 | ORAL_TABLET | Freq: Every day | ORAL | Status: DC
  Administered 2022-07-08 – 2022-07-09 (×2): 2 via ORAL
  Filled 2022-07-07 (×2): qty 2

## 2022-07-07 MED ORDER — METHYLERGONOVINE MALEATE 0.2 MG/ML IJ SOLN: 0.2000 mg | INTRAMUSCULAR | Status: DC | PRN

## 2022-07-07 MED ORDER — LACTATED RINGERS IV SOLN: INTRAVENOUS | Status: DC

## 2022-07-07 MED ORDER — DROPERIDOL 2.5 MG/ML IJ SOLN: 0.6250 mg | Freq: Once | INTRAMUSCULAR | Status: DC | PRN

## 2022-07-07 MED ORDER — FENTANYL CITRATE (PF) 100 MCG/2ML IJ SOLN: 25.0000 ug | INTRAMUSCULAR | Status: DC | PRN

## 2022-07-07 MED ORDER — DIPHENHYDRAMINE HCL 25 MG PO CAPS: 25.0000 mg | ORAL_CAPSULE | ORAL | Status: DC | PRN

## 2022-07-07 MED ORDER — FENTANYL CITRATE (PF) 100 MCG/2ML IJ SOLN
INTRAMUSCULAR | Status: DC | PRN
  Administered 2022-07-07: 85 ug via EPIDURAL

## 2022-07-07 MED ORDER — NALOXONE HCL 4 MG/10ML IJ SOLN: 1.0000 ug/kg/h | INTRAVENOUS | Status: DC | PRN

## 2022-07-07 MED ORDER — SIMETHICONE 80 MG PO CHEW: 80.0000 mg | CHEWABLE_TABLET | ORAL | Status: DC | PRN

## 2022-07-07 MED ORDER — MORPHINE SULFATE (PF) 0.5 MG/ML IJ SOLN
INTRAMUSCULAR | Status: DC | PRN
  Administered 2022-07-07: .15 mg via INTRATHECAL

## 2022-07-07 MED ORDER — ONDANSETRON HCL 4 MG/2ML IJ SOLN: 4.0000 mg | Freq: Three times a day (TID) | INTRAMUSCULAR | Status: DC | PRN

## 2022-07-07 MED ORDER — BUPIVACAINE HCL (PF) 0.25 % IJ SOLN
INTRAMUSCULAR | Status: AC
  Filled 2022-07-07: qty 30

## 2022-07-07 MED ORDER — CEFAZOLIN SODIUM-DEXTROSE 2-4 GM/100ML-% IV SOLN
INTRAVENOUS | Status: AC
  Filled 2022-07-07: qty 100

## 2022-07-07 MED ORDER — BUPIVACAINE LIPOSOME 1.3 % IJ SUSP
INTRAMUSCULAR | Status: AC
  Filled 2022-07-07: qty 20

## 2022-07-07 MED ORDER — LIDOCAINE HCL (PF) 1 % IJ SOLN
INTRAMUSCULAR | Status: AC
  Filled 2022-07-07: qty 10

## 2022-07-07 MED ORDER — DIPHENHYDRAMINE HCL 25 MG PO CAPS: 25.0000 mg | ORAL_CAPSULE | Freq: Four times a day (QID) | ORAL | Status: DC | PRN

## 2022-07-07 MED ORDER — ENOXAPARIN SODIUM 80 MG/0.8ML IJ SOSY
70.0000 mg | PREFILLED_SYRINGE | INTRAMUSCULAR | Status: DC
  Administered 2022-07-08 – 2022-07-09 (×2): 70 mg via SUBCUTANEOUS
  Filled 2022-07-07 (×2): qty 0.8

## 2022-07-07 MED ORDER — LIDOCAINE-EPINEPHRINE (PF) 2 %-1:200000 IJ SOLN
INTRAMUSCULAR | Status: DC | PRN
  Administered 2022-07-07: 2 mL via EPIDURAL
  Administered 2022-07-07: 3 mL via EPIDURAL
  Administered 2022-07-07: 2 mL via EPIDURAL

## 2022-07-07 MED ORDER — OXYTOCIN-SODIUM CHLORIDE 30-0.9 UT/500ML-% IV SOLN
INTRAVENOUS | Status: AC
  Filled 2022-07-07: qty 500

## 2022-07-07 MED ORDER — ONDANSETRON HCL 4 MG/2ML IJ SOLN
INTRAMUSCULAR | Status: DC | PRN
  Administered 2022-07-07: 4 mg via INTRAVENOUS

## 2022-07-07 MED ORDER — ACETAMINOPHEN 500 MG PO TABS
1000.0000 mg | ORAL_TABLET | Freq: Four times a day (QID) | ORAL | Status: DC
  Administered 2022-07-07 – 2022-07-09 (×7): 1000 mg via ORAL
  Filled 2022-07-07 (×7): qty 2

## 2022-07-07 MED ORDER — TRANEXAMIC ACID-NACL 1000-0.7 MG/100ML-% IV SOLN
INTRAVENOUS | Status: AC
  Filled 2022-07-07: qty 100

## 2022-07-07 MED ORDER — OXYCODONE HCL 5 MG PO TABS: 5.0000 mg | ORAL_TABLET | ORAL | Status: DC | PRN

## 2022-07-07 MED ORDER — DEXAMETHASONE SODIUM PHOSPHATE 10 MG/ML IJ SOLN
INTRAMUSCULAR | Status: DC | PRN
  Administered 2022-07-07: 10 mg via INTRAVENOUS

## 2022-07-07 MED ORDER — SOD CITRATE-CITRIC ACID 500-334 MG/5ML PO SOLN
ORAL | Status: AC
  Filled 2022-07-07: qty 30

## 2022-07-07 MED ORDER — METHYLERGONOVINE MALEATE 0.2 MG PO TABS: 0.2000 mg | ORAL_TABLET | ORAL | Status: DC | PRN

## 2022-07-07 MED ORDER — OXYTOCIN-SODIUM CHLORIDE 30-0.9 UT/500ML-% IV SOLN
INTRAVENOUS | Status: DC | PRN
  Administered 2022-07-07: 200 mL via INTRAVENOUS
  Administered 2022-07-07: 300 mL via INTRAVENOUS

## 2022-07-07 MED ORDER — COCONUT OIL OIL
1.0000 | TOPICAL_OIL | Status: DC | PRN
  Administered 2022-07-09: 1 via TOPICAL

## 2022-07-07 MED ORDER — ACETAMINOPHEN 500 MG PO TABS
1000.0000 mg | ORAL_TABLET | ORAL | Status: AC
  Administered 2022-07-07: 1000 mg via ORAL

## 2022-07-07 SURGICAL SUPPLY — 39 items
BARRIER ADHS 3X4 INTERCEED (GAUZE/BANDAGES/DRESSINGS) IMPLANT
BENZOIN TINCTURE PRP APPL 2/3 (GAUZE/BANDAGES/DRESSINGS) ×1 IMPLANT
BINDER ABDOMINAL 10 UNV 27-48 (MISCELLANEOUS) IMPLANT
BINDER ABDOMINAL 12 ML 46-62 (SOFTGOODS) IMPLANT
CHLORAPREP W/TINT 26ML (MISCELLANEOUS) ×2 IMPLANT
CLAMP UMBILICAL CORD (MISCELLANEOUS) ×1 IMPLANT
CLOTH BEACON ORANGE TIMEOUT ST (SAFETY) ×1 IMPLANT
DRSG OPSITE POSTOP 4X10 (GAUZE/BANDAGES/DRESSINGS) ×1 IMPLANT
ELECT REM PT RETURN 9FT ADLT (ELECTROSURGICAL) ×1
ELECTRODE REM PT RTRN 9FT ADLT (ELECTROSURGICAL) ×1 IMPLANT
EXTRACTOR VACUUM KIWI (MISCELLANEOUS) IMPLANT
GAUZE SPONGE 4X4 12PLY STRL LF (GAUZE/BANDAGES/DRESSINGS) IMPLANT
GLOVE BIOGEL M 7.0 STRL (GLOVE) ×2 IMPLANT
GLOVE BIOGEL PI IND STRL 7.0 (GLOVE) ×3 IMPLANT
GOWN STRL REUS W/TWL LRG LVL3 (GOWN DISPOSABLE) ×3 IMPLANT
KIT ABG SYR 3ML LUER SLIP (SYRINGE) IMPLANT
NDL HYPO 25X5/8 SAFETYGLIDE (NEEDLE) IMPLANT
NEEDLE HYPO 25X5/8 SAFETYGLIDE (NEEDLE) IMPLANT
NS IRRIG 1000ML POUR BTL (IV SOLUTION) ×1 IMPLANT
PACK C SECTION WH (CUSTOM PROCEDURE TRAY) ×1 IMPLANT
PAD ABD 7.5X8 STRL (GAUZE/BANDAGES/DRESSINGS) IMPLANT
PAD ABD DERMACEA PRESS 5X9 (GAUZE/BANDAGES/DRESSINGS) IMPLANT
PAD OB MATERNITY 4.3X12.25 (PERSONAL CARE ITEMS) ×1 IMPLANT
RETRACTOR TRAXI PANNICULUS (MISCELLANEOUS) IMPLANT
RTRCTR C-SECT PINK 25CM LRG (MISCELLANEOUS) IMPLANT
STRIP CLOSURE SKIN 1/2X4 (GAUZE/BANDAGES/DRESSINGS) ×1 IMPLANT
SUT MNCRL 0 VIOLET CTX 36 (SUTURE) ×2 IMPLANT
SUT MONOCRYL 0 CTX 36 (SUTURE) ×2
SUT PDS AB 0 CT1 27 (SUTURE) ×2 IMPLANT
SUT PLAIN 0 NONE (SUTURE) IMPLANT
SUT PLAIN 2 0 XLH (SUTURE) IMPLANT
SUT VIC AB 2-0 CT1 27 (SUTURE) ×1
SUT VIC AB 2-0 CT1 TAPERPNT 27 (SUTURE) ×1 IMPLANT
SUT VIC AB 3-0 SH 27 (SUTURE)
SUT VIC AB 3-0 SH 27X BRD (SUTURE) IMPLANT
SUT VIC AB 4-0 KS 27 (SUTURE) ×1 IMPLANT
TOWEL OR 17X24 6PK STRL BLUE (TOWEL DISPOSABLE) ×1 IMPLANT
TRAY FOLEY W/BAG SLVR 14FR LF (SET/KITS/TRAYS/PACK) ×1 IMPLANT
WATER STERILE IRR 1000ML POUR (IV SOLUTION) ×1 IMPLANT

## 2022-07-07 NOTE — Op Note (Signed)
Cesarean Section Procedure Note  Indications:  history of cesarean section x 2/ large 10 cm posterior fibroid   Pre-operative Diagnosis: 39 week 3 day pregnancy.  Post-operative Diagnosis: same  Surgeon: Christophe Louis M.D.  Assistants: Dr. Drema Dallas assisted due to complexity of the anatomy and concern for pelvic adhesive disease   Anesthesia: Epidural anesthesia  ASA Class: 2   Procedure Details   The patient was seen in the Holding Room. The risks, benefits, complications, treatment options, and expected outcomes were discussed with the patient.  The patient concurred with the proposed plan, giving informed consent.  The site of surgery properly noted/marked. The patient was taken to Operating Room # A, identified as [facility] and the procedure verified as C-Section Delivery. A Time Out was held and the above information confirmed.  After induction of anesthesia, the patient was draped and prepped in the usual sterile manner. A Pfannenstiel incision was made and carried down through the subcutaneous tissue to the fascia. Fascial incision was made and extended transversely. The fascia was separated from the underlying rectus tissue superiorly and inferiorly. The peritoneum was identified and entered. Peritoneal incision was extended longitudinally. The utero-vesical peritoneal reflection was incised transversely and the bladder flap was bluntly freed from the lower uterine segment. A low transverse uterine incision was made. Delivered from cephalic presentation was a 3640 gram Female with Apgar scores of 8 at one minute and 9 at five minutes. After the umbilical cord was clamped and cut cord blood was obtained for evaluation. The placenta was removed intact and appeared normal. The uterine outline, tubes and ovaries appeared normal. The uterine incision was closed with running locked sutures of  0 monocryl  . Hemostasis was observed. Lavage was carried out until clear. Interceed was  placed along the uterine incision. The fascia was then reapproximated with running sutures of  0 pds. The fascia and subcutaneous layer were injected with exparel  . The subcutaneous layer was reapproximated with 2-0 plain gut. The skin was reapproximated with  4-0 vicryl .  Instrument, sponge, and needle counts were correct prior the abdominal closure and at the conclusion of the case.   Findings: Female infant in the cephalic presentation. Normal appearing fallopian tubes and ovaries   Estimated Blood Loss:   578 mL         Drains: None         Total IV Fluids:  per anesthesia ml         Specimens: Placenta to labor and delivery           Implants: None          Complications:  None; patient tolerated the procedure well.         Disposition: PACU - hemodynamically stable.         Condition: stable  Attending Attestation: I performed the procedure.

## 2022-07-07 NOTE — Anesthesia Postprocedure Evaluation (Signed)
Anesthesia Post Note  Patient: Michele Pacheco  Procedure(s) Performed: CESAREAN SECTION     Patient location during evaluation: PACU Anesthesia Type: Spinal and Epidural Level of consciousness: awake and alert Pain management: pain level controlled Vital Signs Assessment: post-procedure vital signs reviewed and stable Respiratory status: spontaneous breathing, nonlabored ventilation and respiratory function stable Cardiovascular status: blood pressure returned to baseline Postop Assessment: no apparent nausea or vomiting, spinal receding, no headache and no backache Anesthetic complications: no   No notable events documented.  Last Vitals:  Vitals:   07/07/22 1315 07/07/22 1415  BP: (!) 112/57 130/76  Pulse: 62 62  Resp: 18 17  Temp: 37.1 C 36.4 C  SpO2: 99% 98%    Last Pain:  Vitals:   07/07/22 1415  TempSrc: Oral  PainSc:    Pain Goal: Patients Stated Pain Goal: 5 (07/07/22 1230)              Epidural/Spinal Function Cutaneous sensation: Normal sensation (07/07/22 1415), Patient able to flex knees: Yes (07/07/22 1415), Patient able to lift hips off bed: Yes (07/07/22 1415), Back pain beyond tenderness at insertion site: No (07/07/22 1415), Progressively worsening motor and/or sensory loss: No (07/07/22 1415), Bowel and/or bladder incontinence post epidural: No (07/07/22 1415)  Marthenia Rolling

## 2022-07-07 NOTE — Anesthesia Procedure Notes (Signed)
Spinal  Patient location during procedure: OR Start time: 07/07/2022 9:51 AM End time: 07/07/2022 9:54 AM Reason for block: surgical anesthesia Staffing Performed: anesthesiologist  Anesthesiologist: Brennan Bailey, MD Performed by: Brennan Bailey, MD Authorized by: Brennan Bailey, MD   Preanesthetic Checklist Completed: patient identified, IV checked, risks and benefits discussed, monitors and equipment checked, pre-op evaluation and timeout performed Spinal Block Patient position: sitting Prep: DuraPrep and site prepped and draped Patient monitoring: heart rate, continuous pulse ox and blood pressure Approach: midline Location: L3-4 Injection technique: single-shot Needle Needle type: Pencan  Needle gauge: 24 G Needle length: 10 cm Assessment Sensory level: T4 Events: CSF return Additional Notes Risks, benefits, and alternative discussed. Patient gave consent to procedure. Prepped and draped in sitting position. Clear CSF obtained after one needle pass. Positive terminal aspiration. No pain or paraesthesias with injection. Patient tolerated procedure well. Vital signs stable. Tawny Asal, MD

## 2022-07-07 NOTE — Anesthesia Procedure Notes (Signed)
Epidural Patient location during procedure: OR Start time: 07/07/2022 10:26 AM End time: 07/07/2022 10:30 AM  Staffing Anesthesiologist: Brennan Bailey, MD Performed: anesthesiologist   Preanesthetic Checklist Completed: patient identified, IV checked, risks and benefits discussed, monitors and equipment checked, pre-op evaluation and timeout performed  Epidural Patient position: sitting Prep: DuraPrep and site prepped and draped Patient monitoring: continuous pulse ox, heart rate and blood pressure Approach: midline Location: L3-L4 Injection technique: LOR air  Needle:  Needle type: Tuohy  Needle gauge: 17 G Needle length: 9 cm Needle insertion depth: 9 cm Catheter type: closed end flexible Catheter size: 19 Gauge Catheter at skin depth: 14 cm  Assessment Sensory level: T4 Events: blood not aspirated, injection not painful, no injection resistance, no paresthesia and negative IV test  Additional Notes Patient identified. Risks, benefits, and alternatives discussed with patient including but not limited to bleeding, infection, nerve damage, paralysis, failed block, headache, blood pressure changes, nausea, vomiting, reactions to medication, itching, and postpartum back pain. Confirmed the patient's most recent platelet count. Confirmed with patient that they are not currently taking any anticoagulation, have any bleeding history, or any family history of bleeding disorders. Patient expressed understanding and wished to proceed. All questions were answered. Sterile technique was used throughout the entire procedure.    2 attempts required. Crisp LOR at [employer]. Epidural catheter threaded easily. Negative aspiration of catheter for heme or CSF prior to injection of medications.

## 2022-07-07 NOTE — Lactation Note (Signed)
This note was copied from a baby's chart. Lactation Consultation Note  Patient Name: Michele Pacheco [id number] Date: 07/07/2022 Reason for consult: Initial assessment;Term Age:36 hours Mom stated the baby has been latching well BF at short intervals and sleeping. Mom denies painful latches so far. Baby does has labial frenulum. Suggested mom flange baby's lip when latching. Flanges well. Baby does chomp  at first then suckles well. Newborn feeding habits, behavior, STS, I&O, positioning, support reviewed. Encouraged mom to call for assistance as needed.  Maternal Data Has patient been taught Hand Expression?: Yes Does the patient have breastfeeding experience prior to this delivery?: Yes How long did the patient breastfeed?: 3 months 1st child, 2nd child 2 weeks then pumped and bottle fed 3 months  Feeding    LATCH Score Latch: Grasps breast easily, tongue down, lips flanged, rhythmical sucking.  Audible Swallowing: A few with stimulation  Type of Nipple: Everted at rest and after stimulation  Comfort (Breast/Nipple): Soft / non-tender  Hold (Positioning): Assistance needed to correctly position infant at breast and maintain latch.  LATCH Score: 8   Lactation Tools Discussed/Used    Interventions Interventions: Breast feeding basics reviewed;Assisted with latch;Skin to skin;Breast massage;Hand express;Breast compression;Adjust position;Support pillows;Position options;LC Services brochure  Discharge    Consult Status Consult Status: Follow-up Date: 07/08/22 Follow-up type: In-patient    Theodoro Kalata 07/07/2022, 10:44 PM

## 2022-07-07 NOTE — Transfer of Care (Signed)
Immediate Anesthesia Transfer of Care Note  Patient: Michele Pacheco  Procedure(s) Performed: CESAREAN SECTION  Patient Location: PACU  Anesthesia Type:Epidural  Level of Consciousness: awake  Airway & Oxygen Therapy: Patient Spontanous Breathing  Post-op Assessment: Report given to RN  Post vital signs: Reviewed and stable  Last Vitals:  Vitals Value Taken Time  BP 139/51 07/07/22 1208  Temp    Pulse 71 07/07/22 1210  Resp 19 07/07/22 1210  SpO2 97 % 07/07/22 1210  Vitals shown include unvalidated device data.  Last Pain:  Vitals:   07/07/22 0808  TempSrc: Oral      Patients Stated Pain Goal: 0 (95/32/02 3343)  Complications: No notable events documented.

## 2022-07-07 NOTE — Anesthesia Preprocedure Evaluation (Addendum)
Anesthesia Evaluation  Patient identified by MRN, date of birth, ID band Patient awake    Reviewed: Allergy & Precautions, NPO status , Patient's Chart, lab work & pertinent test results  History of Anesthesia Complications Negative for: history of anesthetic complications  Airway Mallampati: I  TM Distance: >3 FB Neck ROM: Full    Dental no notable dental hx.    Pulmonary neg pulmonary ROS,    Pulmonary exam normal        Cardiovascular negative cardio ROS Normal cardiovascular exam     Neuro/Psych negative neurological ROS  negative psych ROS   GI/Hepatic negative GI ROS, Neg liver ROS,   Endo/Other  Morbid obesity (BMI 47)  Renal/GU negative Renal ROS  negative genitourinary   Musculoskeletal negative musculoskeletal ROS (+)   Abdominal   Peds  Hematology negative hematology ROS (+)   Anesthesia Other Findings Day of surgery medications reviewed with patient.  Reproductive/Obstetrics (+) Pregnancy (Hx of C/S x2)                           Anesthesia Physical Anesthesia Plan  ASA: 3  Anesthesia Plan: Spinal   Post-op Pain Management:    Induction:   PONV Risk Score and Plan: 4 or greater and Treatment may vary due to age or medical condition, Ondansetron and Dexamethasone  Airway Management Planned: Natural Airway  Additional Equipment: None  Intra-op Plan:   Post-operative Plan:   Informed Consent: I have reviewed the patients History and Physical, chart, labs and discussed the procedure including the risks, benefits and alternatives for the proposed anesthesia with the patient or authorized representative who has indicated his/her understanding and acceptance.       Plan Discussed with: CRNA  Anesthesia Plan Comments:        Anesthesia Quick Evaluation

## 2022-07-07 NOTE — H&P (Signed)
Date of Initial H&P: 07/06/2022  History reviewed, patient examined, no change in status, stable for surgery.

## 2022-07-08 LAB — CBC
HCT: 30.5 % — ABNORMAL LOW (ref 36.0–46.0)
Hemoglobin: 10.5 g/dL — ABNORMAL LOW (ref 12.0–15.0)
MCH: 29.2 pg (ref 26.0–34.0)
MCHC: 34.4 g/dL (ref 30.0–36.0)
MCV: 85 fL (ref 80.0–100.0)
Platelets: 241 10*3/uL (ref 150–400)
RBC: 3.59 MIL/uL — ABNORMAL LOW (ref 3.87–5.11)
RDW: 13.9 % (ref 11.5–15.5)
WBC: 15.1 10*3/uL — ABNORMAL HIGH (ref 4.0–10.5)
nRBC: 0 % (ref 0.0–0.2)

## 2022-07-08 LAB — BIRTH TISSUE RECOVERY COLLECTION (PLACENTA DONATION)

## 2022-07-08 NOTE — Lactation Note (Signed)
This note was copied from a baby's chart. Lactation Consultation Note  Patient Name: Michele Pacheco [id number] Date: 07/08/2022 Reason for consult: Follow-up assessment;Term Age:36 hours P3, term female infant. Infant had 6 stools and 3 voids diapers since birth. Per Birth Parent,  infant is breastfeeding well. Birth Parent latched on her left breast using the football hold position and infant was still breastfeeding after 11 minutes when Jefferson left the room. Birth Parent will continue to BF infant according to hunger cues, on demand, 8 to 12+ times within 24 hours, STS. Mom made aware of O/P services, breastfeeding support groups, community resources, and our phone # for post-discharge questions.    Maternal Data Has patient been taught Hand Expression?: Yes  Feeding Mother's Current Feeding Choice: Breast Milk  LATCH Score Latch: Grasps breast easily, tongue down, lips flanged, rhythmical sucking.  Audible Swallowing: Spontaneous and intermittent  Type of Nipple: Everted at rest and after stimulation  Comfort (Breast/Nipple): Soft / non-tender  Hold (Positioning): Assistance needed to correctly position infant at breast and maintain latch.  LATCH Score: 9   Lactation Tools Discussed/Used    Interventions Interventions: Breast feeding basics reviewed;Assisted with latch;Skin to skin;Breast massage;Breast compression;Adjust position;Support pillows;Position options;Expressed milk;Education;LC Services brochure  Discharge    Consult Status Consult Status: Follow-up Date: 07/09/22 Follow-up type: In-patient    Vicente Serene 07/08/2022, 6:10 PM

## 2022-07-08 NOTE — Progress Notes (Signed)
Subjective: Postpartum Day 1: Cesarean Delivery Patient reports incisional pain and tolerating PO.    Objective: Vital signs in last 24 hours: Temp:  [97.6 F (36.4 C)-98.7 F (37.1 C)] 98.3 F (36.8 C) (10/24 0256) Pulse Rate:  [62-80] 76 (10/24 0256) Resp:  [14-20] 18 (10/24 0256) BP: (108-139)/(51-76) 133/76 (10/24 0256) SpO2:  [95 %-99 %] 97 % (10/24 0256)  Physical Exam:  General: alert, cooperative, and no distress Lochia: appropriate Uterine Fundus: firm Incision: healing well DVT Evaluation: No evidence of DVT seen on physical exam.  Recent Labs    07/08/22 0545  HGB 10.5*  HCT 30.5*    Assessment/Plan: Status post Cesarean section. Doing well postoperatively.  Continue current care Obesity - lovenox 40 mg Hartman daily .  Christophe Louis, MD 07/08/2022, 8:24 AM

## 2022-07-08 NOTE — Lactation Note (Signed)
This note was copied from a baby's chart. Lactation Consultation Note  Patient Name: Michele Pacheco [id number] Date: 07/08/2022   Age:36 hours   P3: Term infant at 39+3 weeks Feeding preference: Breast  Birth parent called for latch assistance.  When I arrived, both parent and baby were asleep.  Awakened birth parent and discussed feeding cues; baby was not showing cues. Suggested parent call for assistance when baby is ready to feed and showing cues. Swaddled baby and placed in bassinet.  Birth parent will call for further assistance as needed.  Support person present and asleep.   Maternal Data    Feeding    LATCH Score                    Lactation Tools Discussed/Used    Interventions    Discharge    Consult Status      Michele Pacheco 07/08/2022, 5:41 AM

## 2022-07-09 MED ORDER — ACETAMINOPHEN 500 MG PO TABS: 1000.0000 mg | ORAL_TABLET | Freq: Three times a day (TID) | ORAL | 0 refills | Status: AC | PRN

## 2022-07-09 MED ORDER — OXYCODONE HCL 5 MG PO TABS: 5.0000 mg | ORAL_TABLET | ORAL | 0 refills | Status: AC | PRN

## 2022-07-09 MED ORDER — IBUPROFEN 600 MG PO TABS: 600.0000 mg | ORAL_TABLET | Freq: Four times a day (QID) | ORAL | 1 refills | Status: AC | PRN

## 2022-07-09 NOTE — Discharge Summary (Signed)
   Postpartum Discharge Summary  Date of Service updated 07/09/2022     Patient Name: Michele Pacheco DOB: 08/01/1986 MRN: [id number]  Date of admission: 07/07/2022 Delivery date:07/07/2022  Delivering provider: COLE, TARA  Date of discharge: 07/09/2022  Admitting diagnosis: History of cesarean section complicating pregnancy [O34.219] Intrauterine pregnancy: [redacted]w[redacted]d     Secondary diagnosis:  Principal Problem:   History of cesarean section complicating pregnancy  Additional problems: None    Discharge diagnosis: Term Pregnancy Delivered                                              Post partum procedures: None Augmentation: N/A Complications: None  Hospital course: Sceduled C/S   36 y.o. yo G3P3003 at [redacted]w[redacted]d was admitted to the hospital 07/07/2022 for scheduled cesarean section with the following indication:Elective Repeat.Delivery details are as follows:  Membrane Rupture Time/Date: 10:57 AM ,07/07/2022   Delivery Method:C-Section, Low Transverse  Details of operation can be found in separate operative note.  Patient had a postpartum course complicated by nothing.  She is ambulating, tolerating a regular diet, passing flatus, and urinating well. Patient is discharged home in stable condition on  07/09/22        Newborn Data: Birth date:07/07/2022  Birth time:10:58 AM  Gender:Female  Living status:Living  Apgars:8 ,9  Weight:3640 g     Magnesium Sulfate received: No BMZ received: No Rhophylac:N/A MMR:N/A T-DaP:Given prenatally Flu: N/A Transfusion:No  Physical exam  Vitals:   07/08/22 0256 07/08/22 1458 07/08/22 2134 07/09/22 0517  BP: 133/76 116/73 127/71 121/76  Pulse: 76 76 80 76  Resp: 18 17 18 18  Temp: 98.3 F (36.8 C) 98.3 F (36.8 C) 98.5 F (36.9 C) 98.1 F (36.7 C)  TempSrc: Oral Oral Oral Oral  SpO2: 97% 98% 100% 98%  Weight:      Height:       General: alert, cooperative, and no distress Lochia: appropriate Uterine Fundus: firm Incision:  Healing well with no significant drainage DVT Evaluation: No evidence of DVT seen on physical exam. Labs: Lab Results  Component Value Date   WBC 15.1 (H) 07/08/2022   HGB 10.5 (L) 07/08/2022   HCT 30.5 (L) 07/08/2022   MCV 85.0 07/08/2022   PLT 241 07/08/2022       No data to display         Edinburgh Score:    07/08/2022    9:24 AM  Edinburgh Postnatal Depression Scale Screening Tool  I have been able to laugh and see the funny side of things. 0  I have looked forward with enjoyment to things. 0  I have blamed myself unnecessarily when things went wrong. 1  I have been anxious or worried for no good reason. 1  I have felt scared or panicky for no good reason. 0  Things have been getting on top of me. 1  I have been so unhappy that I have had difficulty sleeping. 0  I have felt sad or miserable. 0  I have been so unhappy that I have been crying. 0  The thought of harming myself has occurred to me. 0  Edinburgh Postnatal Depression Scale Total 3      After visit meds:  Allergies as of 07/09/2022       Reactions   Vitamin C Rash        Medication List       TAKE these medications    acetaminophen 500 MG tablet Commonly known as: TYLENOL Take 2 tablets (1,000 mg total) by mouth every 8 (eight) hours as needed.   ibuprofen 600 MG tablet Commonly known as: ADVIL Take 1 tablet (600 mg total) by mouth every 6 (six) hours as needed.   oxyCODONE 5 MG immediate release tablet Commonly known as: Oxy IR/ROXICODONE Take 1 tablet (5 mg total) by mouth every 4 (four) hours as needed for moderate pain.   prenatal multivitamin Tabs tablet Take 1 tablet by mouth at bedtime.   triamcinolone ointment 0.1 % Commonly known as: KENALOG Apply 1 Application topically 2 (two) times daily as needed (itching).         Discharge home in stable condition Infant Feeding: Breast Infant Disposition:home with mother Discharge instruction: per After Visit Summary and  Postpartum booklet. Activity: Advance as tolerated. Pelvic rest for 6 weeks.  Diet: routine diet Anticipated Birth Control: Unsure Postpartum Appointment:2 weeks Additional Postpartum F/U: Incision check 2 weeks  Future Appointments:No future appointments. Follow up Visit:  Follow-up Information     Cole, Tara, MD. Go in 2 week(s).   Specialty: Obstetrics and Gynecology Why: please keep your appointment for incision check in 2 weeks Contact information: [address] [place] [address] [phone]                     07/09/2022 Tara Cole, MD   

## 2022-07-09 NOTE — Lactation Note (Signed)
This note was copied from a baby's chart. Lactation Consultation Note  Patient Name: Michele Pacheco [id number] Date: 07/09/2022 Reason for consult: Follow-up assessment Age:36 hours  P3, Mother has bilateral abrasions on tips of nipples. Observed latch, encouraged depth. For soreness suggest mother apply ebm or coconut oil and alternate with comfort gels. Suggest that if soreness worsens, mother should call for OP appt.  Reviewed engorgement care and monitoring voids/stools.  Feeding Mother's Current Feeding Choice: Breast Milk Nipple Type: Slow - flow  LATCH Score Latch: Grasps breast easily, tongue down, lips flanged, rhythmical sucking.  Audible Swallowing: A few with stimulation  Type of Nipple: Everted at rest and after stimulation  Comfort (Breast/Nipple): Filling, red/small blisters or bruises, mild/mod discomfort  Hold (Positioning): No assistance needed to correctly position infant at breast.  LATCH Score: 8  Interventions Interventions: Breast feeding basics reviewed;Coconut oil;Comfort gels;Education  Discharge  Reviewed engorgement care and monitoring voids/stools.  Consult Status Consult Status: Complete Date: 07/09/22    Vivianne Master [facility] 07/09/2022, 11:32 AM

## 2022-07-12 ENCOUNTER — Inpatient Hospital Stay (HOSPITAL_COMMUNITY)
Admission: AD | Admit: 2022-07-12 | Discharge: 2022-07-12 | Disposition: A | Discharge status: Home / Self Care | Payer: BC Managed Care – PPO | Attending: Obstetrics and Gynecology | Admitting: Obstetrics and Gynecology

## 2022-07-12 ENCOUNTER — Encounter (HOSPITAL_COMMUNITY): Payer: Self-pay | Admitting: Obstetrics and Gynecology

## 2022-07-12 ENCOUNTER — Encounter (HOSPITAL_COMMUNITY): Payer: Self-pay | Admitting: Anesthesiology

## 2022-07-12 DIAGNOSIS — G8918 Other acute postprocedural pain: Secondary | ICD-10-CM | POA: Insufficient documentation

## 2022-07-12 DIAGNOSIS — O9089 Other complications of the puerperium, not elsewhere classified: Secondary | ICD-10-CM | POA: Diagnosis present

## 2022-07-12 DIAGNOSIS — Z98891 History of uterine scar from previous surgery: Secondary | ICD-10-CM | POA: Diagnosis not present

## 2022-07-12 DIAGNOSIS — M7989 Other specified soft tissue disorders: Secondary | ICD-10-CM | POA: Insufficient documentation

## 2022-07-12 DIAGNOSIS — R030 Elevated blood-pressure reading, without diagnosis of hypertension: Secondary | ICD-10-CM | POA: Diagnosis not present

## 2022-07-12 LAB — COMPREHENSIVE METABOLIC PANEL
ALT: 22 U/L (ref 0–44)
AST: 20 U/L (ref 15–41)
Albumin: 2.7 g/dL — ABNORMAL LOW (ref 3.5–5.0)
Alkaline Phosphatase: 61 U/L (ref 38–126)
Anion gap: 8 (ref 5–15)
BUN: 9 mg/dL (ref 6–20)
CO2: 24 mmol/L (ref 22–32)
Calcium: 8.7 mg/dL — ABNORMAL LOW (ref 8.9–10.3)
Chloride: 108 mmol/L (ref 98–111)
Creatinine, Ser: 0.65 mg/dL (ref 0.44–1.00)
GFR, Estimated: >60 mL/min (ref >60)
Glucose, Bld: 83 mg/dL (ref 70–99)
Potassium: 3.7 mmol/L (ref 3.5–5.1)
Sodium: 140 mmol/L (ref 135–145)
Total Bilirubin: 0.4 mg/dL (ref 0.3–1.2)
Total Protein: 5.6 g/dL — ABNORMAL LOW (ref 6.5–8.1)

## 2022-07-12 LAB — CBC
HCT: 31.1 % — ABNORMAL LOW (ref 36.0–46.0)
Hemoglobin: 10.2 g/dL — ABNORMAL LOW (ref 12.0–15.0)
MCH: 28.6 pg (ref 26.0–34.0)
MCHC: 32.8 g/dL (ref 30.0–36.0)
MCV: 87.1 fL (ref 80.0–100.0)
Platelets: 299 10*3/uL (ref 150–400)
RBC: 3.57 MIL/uL — ABNORMAL LOW (ref 3.87–5.11)
RDW: 14.4 % (ref 11.5–15.5)
WBC: 7.8 10*3/uL (ref 4.0–10.5)
nRBC: 0 % (ref 0.0–0.2)

## 2022-07-12 LAB — PROTEIN / CREATININE RATIO, URINE
Creatinine, Urine: 105 mg/dL
Protein Creatinine Ratio: 0.25 mg/mg{Cre} — ABNORMAL HIGH (ref 0.00–0.15)
Total Protein, Urine: 26 mg/dL

## 2022-07-12 NOTE — MAU Note (Addendum)
.  Michele Pacheco is a 36 y.o. at [facility] C/S 5 days here in MAU reporting: swelling in left leg, with no pain that started this morning. Pt has some occasional knee pain but not abnormal due to previous knee surgery. Pt reports taking her prescribed Tylenol and Motrin once today.  No redness, plus 2 edema in left leg and plus 1 in right leg.  No previous GHTN during pregnancy  Onset of complaint: 0700 Pain score: 0/10 Vitals:   07/12/22 1949  BP: (!) 139/90  Pulse: (!) 59  Resp: 18  Temp: 98.3 F (36.8 C)  SpO2: 98%      Lab orders placed from triage:

## 2022-07-12 NOTE — MAU Provider Note (Signed)
Chief Complaint: Leg Swelling   Event Date/Time   First Provider Initiated Contact with Patient 07/12/22 2019      SUBJECTIVE HPI: Michele Pacheco is a 36 y.o. [id number] who is postpartum day # 5 following RLTCS presents to maternity admissions reporting increased swelling in her left leg.  She denies any pain in the leg but her pregnancy swelling had gone down but she woke up this morning with swelling in both legs, worse on the left.  She denies any h/a, epigastric pain, or visual disturbances.  Lochia are light and postoperative pain is well controlled with prescribed medications.   HPI  Past Medical History:  Diagnosis Date   Family history of adverse reaction to anesthesia    mother has nausea/vomiting after anesthesia   Fibroid    Past Surgical History:  Procedure Laterality Date   CESAREAN SECTION N/A 07/09/2016   Procedure: CESAREAN SECTION;  Surgeon: Christophe Louis, MD;  Location: [facility];  Service: Obstetrics;  Laterality: N/A;  RNFA Duffy Rhody confirmed for case   CESAREAN SECTION N/A 08/01/2019   Procedure: CESAREAN SECTION;  Surgeon: Christophe Louis, MD;  Location: [facility];  Service: Obstetrics;  Laterality: N/A;   CESAREAN SECTION N/A 07/07/2022   Procedure: CESAREAN SECTION;  Surgeon: Christophe Louis, MD;  Location: [facility];  Service: Obstetrics;  Laterality: N/A;   KNEE SURGERY     Social History   Socioeconomic History   Marital status: Married    Spouse name: Not on file   Number of children: Not on file   Years of education: Not on file   Highest education level: Not on file  Occupational History   Occupation: Geophysical data processor  Tobacco Use   Smoking status: Never   Smokeless tobacco: Never  Vaping Use   Vaping Use: Never used  Substance and Sexual Activity   Alcohol use: Yes    Comment: occa   Drug use: No   Sexual activity: Not Currently    Partners: Male  Other Topics Concern   Not on file  Social History Narrative   ** Merged History  Encounter **       Married. Education: Other/College. Exercise: Weights/Cardio 1 to 2 times a week for 30 mins- 1 hour.   Social Determinants of Health   Financial Resource Strain: Not on file  Food Insecurity: No Food Insecurity (07/07/2022)   Hunger Vital Sign    Worried About Running Out of Food in the Last Year: Never true    Ran Out of Food in the Last Year: Never true  Transportation Needs: No Transportation Needs (07/07/2022)   PRAPARE - Hydrologist (Medical): No    Lack of Transportation (Non-Medical): No  Physical Activity: Not on file  Stress: Not on file  Social Connections: Not on file  Intimate Partner Violence: Not At Risk (07/07/2022)   Humiliation, Afraid, Rape, and Kick questionnaire    Fear of Current or Ex-Partner: No    Emotionally Abused: No    Physically Abused: No    Sexually Abused: No   Current Facility-Administered Medications on File Prior to Encounter  Medication Dose Route Frequency Provider Last Rate Last Admin   bupivacaine liposome (EXPAREL) 1.3 % injection 266 mg  20 mL Infiltration Once Christophe Louis, MD       Current Outpatient Medications on File Prior to Encounter  Medication Sig Dispense Refill   acetaminophen (TYLENOL) 500 MG tablet Take 2 tablets (1,000 mg total) by  mouth every 8 (eight) hours as needed. 30 tablet 0   ibuprofen (ADVIL) 600 MG tablet Take 1 tablet (600 mg total) by mouth every 6 (six) hours as needed. 30 tablet 1   Prenatal Vit-Fe Fumarate-FA (PRENATAL MULTIVITAMIN) TABS tablet Take 1 tablet by mouth at bedtime.     oxyCODONE (OXY IR/ROXICODONE) 5 MG immediate release tablet Take 1 tablet (5 mg total) by mouth every 4 (four) hours as needed for moderate pain. 20 tablet 0   triamcinolone ointment (KENALOG) 0.1 % Apply 1 Application topically 2 (two) times daily as needed (itching).     Allergies  Allergen Reactions   Vitamin C Rash    ROS:  Review of Systems  Constitutional:  Negative for  chills, fatigue and fever.  Eyes:  Negative for visual disturbance.  Respiratory:  Negative for shortness of breath.   Cardiovascular:  Positive for leg swelling. Negative for chest pain.  Gastrointestinal:  Negative for abdominal pain, nausea and vomiting.  Genitourinary:  Negative for difficulty urinating, dysuria, flank pain, pelvic pain, vaginal bleeding, vaginal discharge and vaginal pain.  Neurological:  Negative for dizziness and headaches.  Psychiatric/Behavioral: Negative.       I have reviewed patient's Past Medical Hx, Surgical Hx, Family Hx, Social Hx, medications and allergies.   Physical Exam  Patient Vitals for the past 24 hrs:  BP Temp Temp src Pulse Resp SpO2 Height Weight  07/12/22 2115 127/74 -- -- (!) 58 -- -- -- --  07/12/22 2105 -- -- -- -- -- 98 % -- --  07/12/22 2102 125/78 -- -- 64 -- -- -- --  07/12/22 2100 -- -- -- -- -- 99 % -- --  07/12/22 2055 -- -- -- -- -- 99 % -- --  07/12/22 2050 -- -- -- -- -- 97 % -- --  07/12/22 2045 125/76 -- -- (!) 55 -- 99 % -- --  07/12/22 2030 138/60 -- -- -- -- -- -- --  07/12/22 2025 -- -- -- -- -- 99 % -- --  07/12/22 2020 -- -- -- -- -- 99 % -- --  07/12/22 2015 (!) 148/87 -- -- 61 -- -- -- --  07/12/22 1949 (!) 139/90 98.3 F (36.8 C) Oral (!) 59 18 98 % '5\' 9"'$  (1.753 m) (!) 139.6 kg   Constitutional: Well-developed, well-nourished female in no acute distress.  Cardiovascular: normal rate Respiratory: normal effort GI: Abd soft, non-tender. Pos BS x 4 MS: Extremities nontender, BLE, +1 -+2 bilaterally, normal ROM, no warmth or erythema bilaterally.  Left calf 46 cm in diameter, right 45 cm.  Negative Homans bilaterally. Neurologic: Alert and oriented x 4.  GU: Neg CVAT.  PELVIC EXAM: Deferred    LAB RESULTS Results for orders placed or performed during the hospital encounter of 07/12/22 (from the past 24 hour(s))  Protein / creatinine ratio, urine     Status: Abnormal   Collection Time: 07/12/22  8:37 PM   Result Value Ref Range   Creatinine, Urine 105 mg/dL   Total Protein, Urine 26 mg/dL   Protein Creatinine Ratio 0.25 (H) 0.00 - 0.15 mg/mg[Cre]  CBC     Status: Abnormal   Collection Time: 07/12/22  9:00 PM  Result Value Ref Range   WBC 7.8 4.0 - 10.5 K/uL   RBC 3.57 (L) 3.87 - 5.11 MIL/uL   Hemoglobin 10.2 (L) 12.0 - 15.0 g/dL   HCT 31.1 (L) 36.0 - 46.0 %   MCV 87.1 80.0 - 100.0 fL  MCH 28.6 26.0 - 34.0 pg   MCHC 32.8 30.0 - 36.0 g/dL   RDW 14.4 11.5 - 15.5 %   Platelets 299 150 - 400 K/uL   nRBC 0.0 0.0 - 0.2 %  Comprehensive metabolic panel     Status: Abnormal   Collection Time: 07/12/22  9:00 PM  Result Value Ref Range   Sodium 140 135 - 145 mmol/L   Potassium 3.7 3.5 - 5.1 mmol/L   Chloride 108 98 - 111 mmol/L   CO2 24 22 - 32 mmol/L   Glucose, Bld 83 70 - 99 mg/dL   BUN 9 6 - 20 mg/dL   Creatinine, Ser 0.65 0.44 - 1.00 mg/dL   Calcium 8.7 (L) 8.9 - 10.3 mg/dL   Total Protein 5.6 (L) 6.5 - 8.1 g/dL   Albumin 2.7 (L) 3.5 - 5.0 g/dL   AST 20 15 - 41 U/L   ALT 22 0 - 44 U/L   Alkaline Phosphatase 61 38 - 126 U/L   Total Bilirubin 0.4 0.3 - 1.2 mg/dL   GFR, Estimated >60 >60 mL/min   Anion gap 8 5 - 15    --/--/B POS (10/20 1004)  IMAGING No results found.  MAU Management/MDM: Orders Placed This Encounter  Procedures   CBC   Comprehensive metabolic panel   Protein / creatinine ratio, urine   Discharge patient    No orders of the defined types were placed in this encounter.   Swelling is mildly more in left leg than right, with calf measurement only 1 cm difference.  No pain, warmth, or erythema.  Negative Homans.  Low suspicion for DVT despite pt risk factors with recent pregnancy, surgery, and BMI.  Blood pressure elevated on intake in MAU but pt without s/sx of PEC. PEC labs wnl and BP normotensive after first 2 blood pressures.  Pt to f/u in office for BP check on 10/30 or 10/31.  Return to MAU with worsening symptoms in leg or s/sx of PEC or other  emergencies.    ASSESSMENT 1. S/P cesarean section   2. Leg swelling   3. Postpartum complication   4. Elevated blood-pressure reading without diagnosis of hypertension     PLAN Discharge home Allergies as of 07/12/2022       Reactions   Vitamin C Rash        Medication List     TAKE these medications    acetaminophen 500 MG tablet Commonly known as: TYLENOL Take 2 tablets (1,000 mg total) by mouth every 8 (eight) hours as needed.   ibuprofen 600 MG tablet Commonly known as: ADVIL Take 1 tablet (600 mg total) by mouth every 6 (six) hours as needed.   oxyCODONE 5 MG immediate release tablet Commonly known as: Oxy IR/ROXICODONE Take 1 tablet (5 mg total) by mouth every 4 (four) hours as needed for moderate pain.   prenatal multivitamin Tabs tablet Take 1 tablet by mouth at bedtime.   triamcinolone ointment 0.1 % Commonly known as: KENALOG Apply 1 Application topically 2 (two) times daily as needed (itching).        Follow-up Information     [facility], [place] Follow up in 3 day(s).   Specialty: Obstetrics and Gynecology Why: Call the office to schedule a blood pressure check on 07/14/22 or 07/15/22. Contact information: [place] [address] [phone]                 Fatima Blank Certified  Nurse-Midwife 07/12/2022  10:05 PM

## 2022-07-18 ENCOUNTER — Telehealth (HOSPITAL_COMMUNITY): Payer: Self-pay | Admitting: *Deleted

## 2022-07-18 NOTE — Telephone Encounter (Signed)
Mom reports feeling good. Incision healing well per mom. No concerns regarding herself at this time. EPDS=4 (hospital score=3) Mom reports baby is well. Feeding, peeing, and pooping without difficulty. Reviewed safe sleep. Mom has no concerns about baby at present.  Odis Hollingshead, RN 07-18-2022 at 9:51am
